# Patient Record
Sex: Male | Born: 1970 | Race: Black or African American | Hispanic: No | Marital: Married | Smoking: Current some day smoker
Health system: Southern US, Community
[De-identification: ages and names within clinical notes are randomized; demographics above are authoritative.]

## PROBLEM LIST (undated history)

## (undated) ENCOUNTER — Ambulatory Visit: Admission: EM | Discharge status: Absent without leave | Payer: BLUE CROSS/BLUE SHIELD

## (undated) DIAGNOSIS — M5020 Other cervical disc displacement, unspecified cervical region: Secondary | ICD-10-CM

## (undated) DIAGNOSIS — M5030 Other cervical disc degeneration, unspecified cervical region: Secondary | ICD-10-CM

## (undated) HISTORY — PX: TONSILLECTOMY: SUR1361

## (undated) HISTORY — PX: VASECTOMY: SHX75

## (undated) HISTORY — PX: OTHER SURGICAL HISTORY: SHX169

---

## 2006-07-17 ENCOUNTER — Ambulatory Visit: Payer: Self-pay | Admitting: Otolaryngology

## 2007-09-07 ENCOUNTER — Ambulatory Visit: Payer: Self-pay | Admitting: Unknown Physician Specialty

## 2008-02-01 ENCOUNTER — Ambulatory Visit: Payer: Self-pay | Admitting: Family Medicine

## 2012-07-23 ENCOUNTER — Ambulatory Visit: Payer: Self-pay | Admitting: Internal Medicine

## 2014-05-31 ENCOUNTER — Ambulatory Visit: Payer: Self-pay | Admitting: Physician Assistant

## 2014-06-02 ENCOUNTER — Ambulatory Visit: Payer: Self-pay | Admitting: Physician Assistant

## 2014-09-29 ENCOUNTER — Ambulatory Visit: Payer: Self-pay | Admitting: Emergency Medicine

## 2015-05-02 ENCOUNTER — Ambulatory Visit
Admission: EM | Admit: 2015-05-02 | Discharge: 2015-05-02 | Disposition: A | Discharge status: Home / Self Care | Payer: BLUE CROSS/BLUE SHIELD | Attending: Family Medicine | Admitting: Family Medicine

## 2015-05-02 DIAGNOSIS — R509 Fever, unspecified: Secondary | ICD-10-CM | POA: Insufficient documentation

## 2015-05-02 DIAGNOSIS — S80262S Insect bite (nonvenomous), left knee, sequela: Secondary | ICD-10-CM | POA: Diagnosis not present

## 2015-05-02 DIAGNOSIS — E871 Hypo-osmolality and hyponatremia: Secondary | ICD-10-CM | POA: Insufficient documentation

## 2015-05-02 DIAGNOSIS — W57XXXS Bitten or stung by nonvenomous insect and other nonvenomous arthropods, sequela: Secondary | ICD-10-CM | POA: Insufficient documentation

## 2015-05-02 LAB — COMPREHENSIVE METABOLIC PANEL
ALT: 43 U/L (ref 17–63)
AST: 27 U/L (ref 15–41)
Albumin: 4 g/dL (ref 3.5–5.0)
Alkaline Phosphatase: 60 U/L (ref 38–126)
Anion gap: 10 (ref 5–15)
BUN: 11 mg/dL (ref 6–20)
CO2: 24 mmol/L (ref 22–32)
Calcium: 8.7 mg/dL — ABNORMAL LOW (ref 8.9–10.3)
Chloride: 98 mmol/L — ABNORMAL LOW (ref 101–111)
Creatinine, Ser: 1.09 mg/dL (ref 0.61–1.24)
GFR calc Af Amer: >60 mL/min (ref >60)
GFR calc non Af Amer: >60 mL/min (ref >60)
Glucose, Bld: 96 mg/dL (ref 65–99)
Potassium: 3.7 mmol/L (ref 3.5–5.1)
Sodium: 132 mmol/L — ABNORMAL LOW (ref 135–145)
Total Bilirubin: 0.9 mg/dL (ref 0.3–1.2)
Total Protein: 8.2 g/dL — ABNORMAL HIGH (ref 6.5–8.1)

## 2015-05-02 LAB — CBC WITH DIFFERENTIAL/PLATELET
Basophils Absolute: 0.1 10*3/uL (ref 0–0.1)
Basophils Relative: 1 %
Eosinophils Absolute: 0.2 10*3/uL (ref 0–0.7)
Eosinophils Relative: 1 %
HCT: 40.8 % (ref 40.0–52.0)
Hemoglobin: 13.5 g/dL (ref 13.0–18.0)
Lymphocytes Relative: 11 %
Lymphs Abs: 1.8 10*3/uL (ref 1.0–3.6)
MCH: 25.7 pg — ABNORMAL LOW (ref 26.0–34.0)
MCHC: 33.1 g/dL (ref 32.0–36.0)
MCV: 77.6 fL — ABNORMAL LOW (ref 80.0–100.0)
Monocytes Absolute: 0.9 10*3/uL (ref 0.2–1.0)
Monocytes Relative: 6 %
Neutro Abs: 13.1 10*3/uL — ABNORMAL HIGH (ref 1.4–6.5)
Neutrophils Relative %: 81 %
Platelets: 322 10*3/uL (ref 150–440)
RBC: 5.25 MIL/uL (ref 4.40–5.90)
RDW: 13.6 % (ref 11.5–14.5)
WBC: 16.2 10*3/uL — ABNORMAL HIGH (ref 3.8–10.6)

## 2015-05-02 LAB — SEDIMENTATION RATE: Sed Rate: 29 mm/hr — ABNORMAL HIGH (ref 0–15)

## 2015-05-02 LAB — RAPID INFLUENZA A&B ANTIGENS
Influenza A (ARMC): NOT DETECTED
Influenza B (ARMC): NOT DETECTED

## 2015-05-02 MED ORDER — DOXYCYCLINE HYCLATE 100 MG PO CAPS: 100.0000 mg | ORAL_CAPSULE | Freq: Two times a day (BID) | ORAL | Status: DC

## 2015-05-02 MED ORDER — IBUPROFEN 800 MG PO TABS
800.0000 mg | ORAL_TABLET | Freq: Once | ORAL | Status: AC
  Administered 2015-05-02: 800 mg via ORAL

## 2015-05-02 NOTE — ED Notes (Signed)
Patient states that he woke up on Saturday, he states that he felt weak, fatigue, chills and night sweats. Patient states that he improved on Monday but is now worse again. He states that he was bit by a tick 3 weeks ago. Patient states that area is a little swollen. States that he is having back and joint pain. Patient states that he has been the worse today than any other day.

## 2015-05-02 NOTE — Discharge Instructions (Signed)
Fever, Adult A fever is a temperature of 100.4 F (38 C) or above.  HOME CARE  Take fever medicine as told by your doctor. Do not  take aspirin for fever if you are younger than 42 years of age.  If you are given antibiotic medicine, take it as told. Finish the medicine even if you start to feel better.  Rest.  Drink enough fluids to keep your pee (urine) clear or pale yellow. Do not drink alcohol.  Take a bath or shower with room temperature water. Do not use ice water or alcohol sponge baths.  Wear lightweight, loose clothes. GET HELP RIGHT AWAY IF:   You are short of breath or have trouble breathing.  You are very weak.  You are dizzy or you pass out (faint).  You are very thirsty or are making little or no urine.  You have new pain.  You throw up (vomit) or have watery poop (diarrhea).  You keep throwing up or having watery poop for more than 1 to 2 days.  You have a stiff neck or light bothers your eyes.  You have a skin rash.  You have a fever or problems (symptoms) that last for more than 2 to 3 days.  You have a fever and your problems quickly get worse.  You keep throwing up the fluids you drink.  You do not feel better after 3 days.  You have new problems. MAKE SURE YOU:   Understand these instructions.  Will watch your condition.  Will get help right away if you are not doing well or get worse. Document Released: 09/09/2008 Document Revised: 02/23/2012 Document Reviewed: 10/02/2011 [facility] Patient Information 2015 [place], [place]. This information is not intended to replace advice given to you by your health care provider. Make sure you discuss any questions you have with your health care provider.  Fever, Adult A fever is a higher than normal body temperature. In an adult, an oral temperature around 98.6 F (37 C) is considered normal. A temperature of 100.4 F (38 C) or higher is generally considered a fever. Mild or moderate fevers generally  have no long-term effects and often do not require treatment. Extreme fever (greater than or equal to 106 F or 41.1 C) can cause seizures. The sweating that may occur with repeated or prolonged fever may cause dehydration. Elderly people can develop confusion during a fever. A measured temperature can vary with:  Age.  Time of day.  Method of measurement (mouth, underarm, rectal, or ear). The fever is confirmed by taking a temperature with a thermometer. Temperatures can be taken different ways. Some methods are accurate and some are not.  An oral temperature is used most commonly. Electronic thermometers are fast and accurate.  An ear temperature will only be accurate if the thermometer is positioned as recommended by the manufacturer.  A rectal temperature is accurate and done for those adults who have a condition where an oral temperature cannot be taken.  An underarm (axillary) temperature is not accurate and not recommended. Fever is a symptom, not a disease.  CAUSES   Infections commonly cause fever.  Some noninfectious causes for fever include:  Some arthritis conditions.  Some thyroid or adrenal gland conditions.  Some immune system conditions.  Some types of cancer.  A medicine reaction.  High doses of certain street drugs such as methamphetamine.  Dehydration.  Exposure to high outside or room temperatures.  Occasionally, the source of a fever cannot be determined. This  is sometimes called a "fever of unknown origin" (FUO).  Some situations may lead to a temporary rise in body temperature that may go away on its own. Examples are:  Childbirth.  Surgery.  Intense exercise. HOME CARE INSTRUCTIONS   Take appropriate medicines for fever. Follow dosing instructions carefully. If you use acetaminophen to reduce the fever, be careful to avoid taking other medicines that also contain acetaminophen. Do not take aspirin for a fever if you are younger than age 42.  There is an association with Reye's syndrome. Reye's syndrome is a rare but potentially deadly disease.  If an infection is present and antibiotics have been prescribed, take them as directed. Finish them even if you start to feel better.  Rest as needed.  Maintain an adequate fluid intake. To prevent dehydration during an illness with prolonged or recurrent fever, you may need to drink extra fluid.Drink enough fluids to keep your urine clear or pale yellow.  Sponging or bathing with room temperature water may help reduce body temperature. Do not use ice water or alcohol sponge baths.  Dress comfortably, but do not over-bundle. SEEK MEDICAL CARE IF:   You are unable to keep fluids down.  You develop vomiting or diarrhea.  You are not feeling at least partly better after 3 days.  You develop new symptoms or problems. SEEK IMMEDIATE MEDICAL CARE IF:   You have shortness of breath or trouble breathing.  You develop excessive weakness.  You are dizzy or you faint.  You are extremely thirsty or you are making little or no urine.  You develop new pain that was not there before (such as in the head, neck, chest, back, or abdomen).  You have persistent vomiting and diarrhea for more than 1 to 2 days.  You develop a stiff neck or your eyes become sensitive to light.  You develop a skin rash. Fever, Adult A fever is a higher than normal body temperature. In an adult, an oral temperature around 98.6 F (37 C) is considered normal. A temperature of 100.4 F (38 C) or higher is generally considered a fever. Mild or moderate fevers generally have no long-term effects and often do not require treatment. Extreme fever (greater than or equal to 106 F or 41.1 C) can cause seizures. The sweating that may occur with repeated or prolonged fever may cause dehydration. Elderly people can develop confusion during a fever. A measured temperature can vary with: Age. Time of day. Method of  measurement (mouth, underarm, rectal, or ear). The fever is confirmed by taking a temperature with a thermometer. Temperatures can be taken different ways. Some methods are accurate and some are not. An oral temperature is used most commonly. Electronic thermometers are fast and accurate. An ear temperature will only be accurate if the thermometer is positioned as recommended by the manufacturer. A rectal temperature is accurate and done for those adults who have a condition where an oral temperature cannot be taken. An underarm (axillary) temperature is not accurate and not recommended. Fever is a symptom, not a disease.  CAUSES  Infections commonly cause fever. Some noninfectious causes for fever include: Some arthritis conditions. Some thyroid or adrenal gland conditions. Some immune system conditions. Some types of cancer. A medicine reaction. High doses of certain street drugs such as methamphetamine. Dehydration. Exposure to high outside or room temperatures. Occasionally, the source of a fever cannot be determined. This is sometimes called a "fever of unknown origin" (FUO). Some situations may lead to a  temporary rise in body temperature that may go away on its own. Examples are: Childbirth. Surgery. Intense exercise. HOME CARE INSTRUCTIONS  Take appropriate medicines for fever. Follow dosing instructions carefully. If you use acetaminophen to reduce the fever, be careful to avoid taking other medicines that also contain acetaminophen. Do not take aspirin for a fever if you are younger than age 42. There is an association with Reye's syndrome. Reye's syndrome is a rare but potentially deadly disease. If an infection is present and antibiotics have been prescribed, take them as directed. Finish them even if you start to feel better. Rest as needed. Maintain an adequate fluid intake. To prevent dehydration during an illness with prolonged or recurrent fever, you may need to drink  extra fluid.Drink enough fluids to keep your urine clear or pale yellow. Sponging or bathing with room temperature water may help reduce body temperature. Do not use ice water or alcohol sponge baths. Dress comfortably, but do not over-bundle. SEEK MEDICAL CARE IF:  You are unable to keep fluids down. You develop vomiting or diarrhea. You are not feeling at least partly better after 3 days. You develop new symptoms or problems. SEEK IMMEDIATE MEDICAL CARE IF:  You have shortness of breath or trouble breathing. You develop excessive weakness. You are dizzy or you faint. You are extremely thirsty or you are making little or no urine. You develop new pain that was not there before (such as in the head, neck, chest, back, or abdomen). You have persistent vomiting and diarrhea for more than 1 to 2 days. You develop a stiff neck or your eyes become sensitive to light. You develop a skin rash. You have a fever or persistent symptoms for more than 2 to 3 days. You have a fever and your symptoms suddenly get worse. MAKE SURE YOU:  Understand these instructions. Will watch your condition. Will get help right away if you are not doing well or get worse. Document Released: 05/27/2001 Document Revised: 04/17/2014 Document Reviewed: 10/02/2011 [facility] Patient Information 2015 [place], [place]. This information is not intended to replace advice given to you by your health care provider. Make sure you discuss any questions you have with your health care provider.  Fever, Adult A fever is a higher than normal body temperature. In an adult, an oral temperature around 98.6 F (37 C) is considered normal. A temperature of 100.4 F (38 C) or higher is generally considered a fever. Mild or moderate fevers generally have no long-term effects and often do not require treatment. Extreme fever (greater than or equal to 106 F or 41.1 C) can cause seizures. The sweating that may occur with repeated or prolonged  fever may cause dehydration. Elderly people can develop confusion during a fever. A measured temperature can vary with: Age. Time of day. Method of measurement (mouth, underarm, rectal, or ear). The fever is confirmed by taking a temperature with a thermometer. Temperatures can be taken different ways. Some methods are accurate and some are not. An oral temperature is used most commonly. Electronic thermometers are fast and accurate. An ear temperature will only be accurate if the thermometer is positioned as recommended by the manufacturer. A rectal temperature is accurate and done for those adults who have a condition where an oral temperature cannot be taken. An underarm (axillary) temperature is not accurate and not recommended. Fever is a symptom, not a disease.  CAUSES  Infections commonly cause fever. Some noninfectious causes for fever include: Some arthritis conditions. Some thyroid  or adrenal gland conditions. Some immune system conditions. Some types of cancer. A medicine reaction. High doses of certain street drugs such as methamphetamine. Dehydration. Exposure to high outside or room temperatures. Occasionally, the source of a fever cannot be determined. This is sometimes called a "fever of unknown origin" (FUO). Some situations may lead to a temporary rise in body temperature that may go away on its own. Examples are: Childbirth. Surgery. Intense exercise. HOME CARE INSTRUCTIONS  Take appropriate medicines for fever. Follow dosing instructions carefully. If you use acetaminophen to reduce the fever, be careful to avoid taking other medicines that also contain acetaminophen. Do not take aspirin for a fever if you are younger than age 52. There is an association with Reye's syndrome. Reye's syndrome is a rare but potentially deadly disease. If an infection is present and antibiotics have been prescribed, take them as directed. Finish them even if you start to feel  better. Rest as needed. Maintain an adequate fluid intake. To prevent dehydration during an illness with prolonged or recurrent fever, you may need to drink extra fluid.Drink enough fluids to keep your urine clear or pale yellow. Sponging or bathing with room temperature water may help reduce body temperature. Do not use ice water or alcohol sponge baths. Dress comfortably, but do not over-bundle. SEEK MEDICAL CARE IF:  You are unable to keep fluids down. You develop vomiting or diarrhea. You are not feeling at least partly better after 3 days. You develop new symptoms or problems. SEEK IMMEDIATE MEDICAL CARE IF:  You have shortness of breath or trouble breathing. You develop excessive weakness. You are dizzy or you faint. You are extremely thirsty or you are making little or no urine. You develop new pain that was not there before (such as in the head, neck, chest, back, or abdomen). You have persistent vomiting and diarrhea for more than 1 to 2 days. You develop a stiff neck or your eyes become sensitive to light. You develop a skin rash. You have a fever or persistent symptoms for more than 2 to 3 days. You have a fever and your symptoms suddenly get worse. MAKE SURE YOU:  Understand these instructions. Will watch your condition. Will get help right away if you are not doing well or get worse. Document Released: 05/27/2001 Document Revised: 04/17/2014 Document Reviewed: 10/02/2011 [facility] Patient Information 2015 [place], [place]. This information is not intended to replace advice given to you by your health care provider. Make sure you discuss any questions you have with your health care provider.   You have a fever or persistent symptoms for more than 2 to 3 days.  You have a fever and your symptoms suddenly get worse. MAKE SURE YOU:   Understand these instructions.  Will watch your condition.  Will get help right away if you are not doing well or get worse. Document  Released: 05/27/2001 Document Revised: 04/17/2014 Document Reviewed: 10/02/2011 [facility] Patient Information 2015 [place], [place]. This information is not intended to replace advice given to you by your health care provider. Make sure you discuss any questions you have with your health care provider.

## 2015-05-02 NOTE — ED Provider Notes (Signed)
CSN: [id number]     Arrival date & time 05/02/15  1719 History   None    Chief Complaint  Patient presents with  . Fever   (Consider location/radiation/quality/duration/timing/severity/associated sxs/prior Treatment) HPI  This is a 44 year old man who presents with fever and fatigue since Saturday, 04/28/2015. He relates that 3 weeks ago he had pulled a tick off his left popliteal fossa and other than local reaction and itching he had no further sequelae. The tick was firmly attached but not engorged. Since the onset of his fever on Saturday he has been having night sweats and sore hips and knees. Fever today presentation was 103F. Yesterday he was going to come to the doctor started feeling better after he moved around a bit. Today however has been much worse and even fell asleep in the truck on 2 occasions because of fatigue. He does not remember seeing a rash. He had his flu shot in November. He denies any cough worse up her respiratory symptoms he has no urinary tract symptoms and denies any nausea vomiting or diarrhea.  History reviewed. No pertinent past medical history. Past Surgical History  Procedure Laterality Date  . Vasectomy    . Tonsillectomy  age 44   Family History  Problem Relation Age of Onset  . Heart failure Mother    History  Substance Use Topics  . Smoking status: Former Smoker    Quit date: 05/02/2011  . Smokeless tobacco: Not on file  . Alcohol Use: 0.0 oz/week    0 Standard drinks or equivalent per week     Comment: occasionally    Review of Systems  Constitutional: Positive for fever, chills, diaphoresis and fatigue.  Musculoskeletal: Positive for myalgias, back pain and arthralgias.  All other systems reviewed and are negative.   Allergies  Review of patient's allergies indicates no known allergies.  Home Medications   Prior to Admission medications   Medication Sig Start Date End Date Taking? Authorizing Provider  gabapentin (NEURONTIN) 100 MG  capsule Take 100 mg by mouth 3 (three) times daily.   Yes Historical Provider, MD  meloxicam (MOBIC) 15 MG tablet Take 15 mg by mouth daily.   Yes Historical Provider, MD  doxycycline (VIBRAMYCIN) 100 MG capsule Take 1 capsule (100 mg total) by mouth 2 (two) times daily. 05/02/15   Chrissie NoaWilliam P Dorsey Authement, PA-C   BP 133/81 mmHg  Pulse 100  Temp(Src) 101.3 F (38.5 C) (Tympanic)  Resp 20  Ht 5\' 11"  (1.803 m)  Wt 240 lb (108.863 kg)  BMI 33.49 kg/m2  SpO2 99% Physical Exam  Constitutional: He is oriented to person, place, and time. He appears well-developed and well-nourished.  HENT:  Head: Normocephalic and atraumatic.  Right Ear: External ear normal.  Left Ear: External ear normal.  Mouth/Throat: Oropharynx is clear and moist.  Eyes: EOM are normal. Pupils are equal, round, and reactive to light. Right eye exhibits no discharge. Left eye exhibits no discharge. No scleral icterus.  Neck: Normal range of motion. Neck supple. No JVD present. No thyromegaly present.  Cardiovascular: Normal rate, regular rhythm and normal heart sounds.  Exam reveals no gallop and no friction rub.   No murmur heard. Pulmonary/Chest: Effort normal and breath sounds normal. No stridor. No respiratory distress. He has no wheezes. He has no rales. He exhibits no tenderness.  Abdominal: Soft. Bowel sounds are normal. He exhibits no distension and no mass. There is no tenderness. There is no rebound and no guarding.  Musculoskeletal: Normal range  of motion. He exhibits no edema or tenderness.  Lymphadenopathy:    He has no cervical adenopathy.  Neurological: He is alert and oriented to person, place, and time. He has normal reflexes.  Skin: Skin is warm. He is diaphoretic.  Elevation of his back shows a tracheal type macular lesions scattered over his back. There are no other rashes on his trunk anteriorly on his legs or arms.  Psychiatric: He has a normal mood and affect. His behavior is normal. Judgment and thought  content normal.    ED Course  Procedures (including critical care time) Labs Review Labs Reviewed  CBC WITH DIFFERENTIAL/PLATELET - Abnormal; Notable for the following:    WBC 16.2 (*)    MCV 77.6 (*)    MCH 25.7 (*)    Neutro Abs 13.1 (*)    All other components within normal limits  SEDIMENTATION RATE - Abnormal; Notable for the following:    Sed Rate 29 (*)    All other components within normal limits  COMPREHENSIVE METABOLIC PANEL - Abnormal; Notable for the following:    Sodium 132 (*)    Chloride 98 (*)    Calcium 8.7 (*)    Total Protein 8.2 (*)    All other components within normal limits  INFLUENZA A&B ANTIGENS([facility])  ROCKY MTN SPOTTED FVR ABS PNL(IGG+IGM)  B. BURGDORFI ANTIBODIES    Imaging Review No results found.   MDM   1. Tick bite of left popliteal region, sequela   2. Fever and chills   3. Hyponatremia    New Prescriptions   DOXYCYCLINE (VIBRAMYCIN) 100 MG CAPSULE    Take 1 capsule (100 mg total) by mouth 2 (two) times daily.   I discussed the laboratory findings and the potential diagnosis. Because of his increased white count and neutrophils and his hyponatremia and fever following a tick bite 3 weeks ago we will start him empirically on doxycycline 100 mg twice a day for 10 days. [facility] and Lyme were obtained. He'll continue to take ibuprofen and alternate with Tylenol to keep his fever under control. Will keep amount of work until Monday and hopefully the fever will have resolved by that time. He'll follow-up here in 48 hours if he is not improving or he may decide to go to his PCP instead. Copies of his laboratories were provided to the patient.    Lutricia FeilWilliam P Lucresia Simic, PA-C 05/02/15 1949

## 2015-05-04 LAB — ROCKY MTN SPOTTED FVR ABS PNL(IGG+IGM)
RMSF IgG: NEGATIVE
RMSF IgM: 0.8 index (ref 0.00–0.89)

## 2015-09-20 ENCOUNTER — Ambulatory Visit: Payer: BLUE CROSS/BLUE SHIELD

## 2015-09-20 ENCOUNTER — Ambulatory Visit
Admission: EM | Admit: 2015-09-20 | Discharge: 2015-09-20 | Disposition: A | Discharge status: Home / Self Care | Payer: BLUE CROSS/BLUE SHIELD | Attending: Family Medicine | Admitting: Family Medicine

## 2015-09-20 ENCOUNTER — Encounter: Payer: Self-pay | Admitting: Emergency Medicine

## 2015-09-20 DIAGNOSIS — S62604A Fracture of unspecified phalanx of right ring finger, initial encounter for closed fracture: Secondary | ICD-10-CM

## 2015-09-20 IMAGING — CR DG FINGER RING 2+V*R*
3 series · 4 of 4 positions shown · non-contrast
Comparison: None.

CLINICAL DATA: Injured the DIP joint of the right fourth finger
with swelling

EXAM:
RIGHT RING FINGER 2+V

[finger ap]
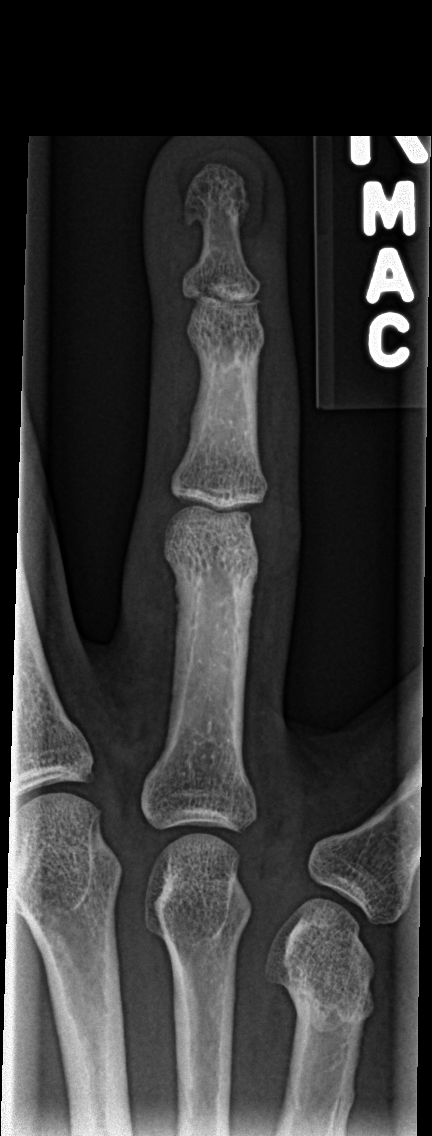

[finger obl]
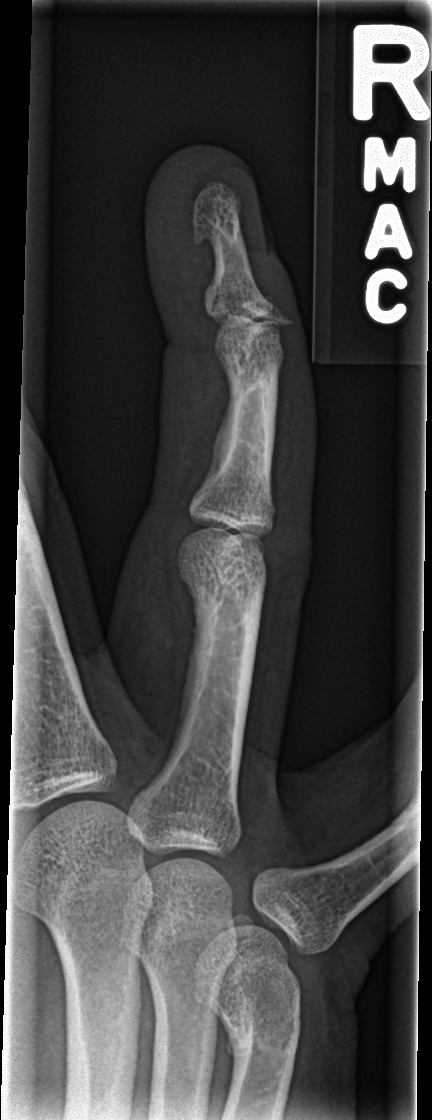

[Series 3: finger lat · 0.14mm/px · 2 of 2 slices shown]
[im 1/2]
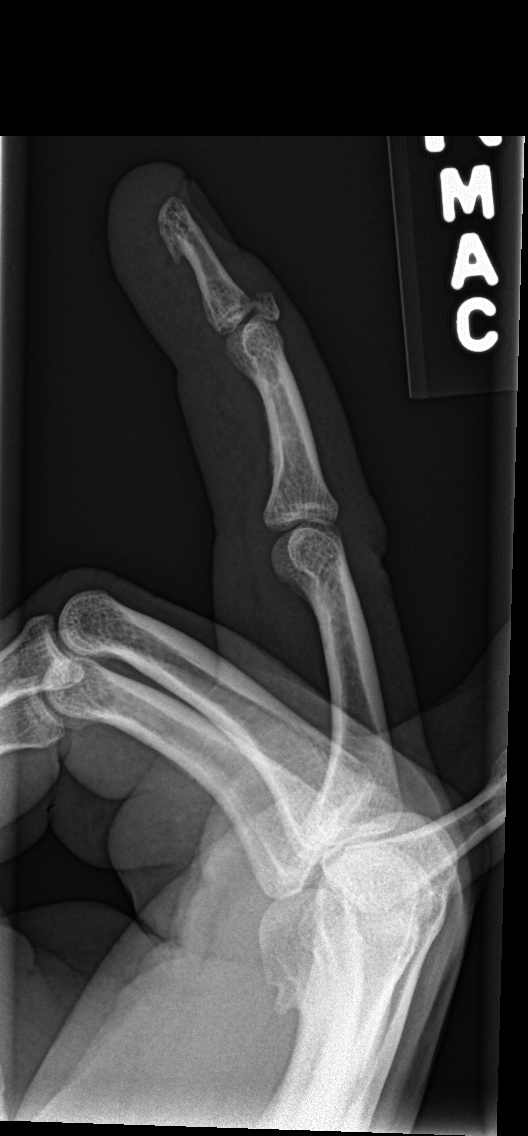
[im 2/2]
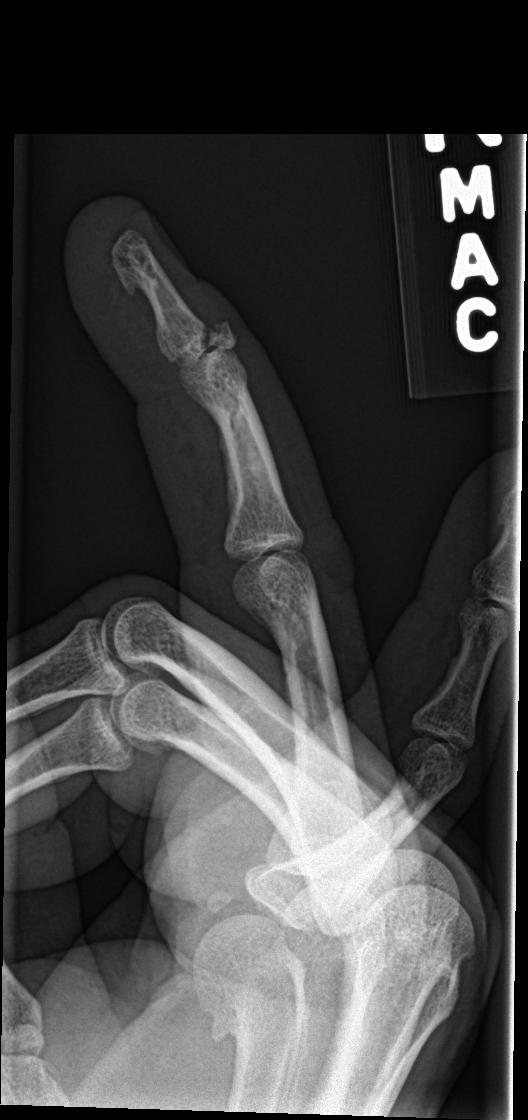

[4 of 4 positions shown; findings below may reference images not displayed]

FINDINGS: There is an avulsion fracture fragment from the base of the distal
phalanx of the right fourth digit on the dorsal aspect. No other
acute abnormality is seen.
IMPRESSION: Avulsion fracture from the base of the distal phalanx of the right
fourth digit.

## 2015-09-20 MED ORDER — MELOXICAM 15 MG PO TABS: 15.0000 mg | ORAL_TABLET | Freq: Every day | ORAL | Status: DC | PRN

## 2015-09-20 NOTE — ED Notes (Signed)
Injured ring right right hand playing football this morning

## 2015-09-20 NOTE — ED Provider Notes (Signed)
[facility] Emergency Department Provider Note  ____________________________________________  Time seen: Approximately 10:59 AM  I have reviewed the triage vital signs and the nursing notes.   HISTORY  Chief Complaint Finger Injury   HPI Jason Vaughan is a 44 y.o. male presents for complaint of right fourth finger pain. Reports that this morning at 7 AM he was playing catch with a football with his son. States the football hit directly on his fourth finger extended. States pain, swelling and pain with movement since. Denies fall or other injury. Denies head injury or loss consciousness. Denies other pain. States current pain is 3 out of 10. Denies tingling, numbness.   History reviewed. No pertinent past medical history.  There are no active problems to display for this patient.   Past Surgical History  Procedure Laterality Date  . Vasectomy    . Tonsillectomy  age 60  . Vascetomy      Current Outpatient Rx  Name  Route  Sig  Dispense  Refill  .           .           .           .             Allergies Review of patient's allergies indicates no known allergies.  Family History  Problem Relation Age of Onset  . Heart failure Mother     Social History Social History  Substance Use Topics  . Smoking status: Former Smoker    Quit date: 05/02/2011  . Smokeless tobacco: None  . Alcohol Use: 0.0 oz/week    0 Standard drinks or equivalent per week     Comment: occasionally    Review of Systems Constitutional: No fever/chills Eyes: No visual changes. ENT: No sore throat. Cardiovascular: Denies chest pain. Respiratory: Denies shortness of breath. Gastrointestinal: No abdominal pain.  No nausea, no vomiting.  No diarrhea.  No constipation. Genitourinary: Negative for dysuria. Musculoskeletal: Negative for back pain.right fourth finger pain Neurological: Negative for headaches, focal weakness or numbness.  10-point ROS otherwise  negative.  ____________________________________________   PHYSICAL EXAM:  VITAL SIGNS: ED Triage Vitals  Enc Vitals Group     BP 09/20/15 0915 108/74 mmHg     Pulse Rate 09/20/15 0915 66     Resp 09/20/15 0915 18     Temp 09/20/15 0915 97.5 F (36.4 C)     Temp src -- oral     SpO2 09/20/15 0915 98 %     Weight 09/20/15 0915 235 lb (106.595 kg)     Height 09/20/15 0915 6' (1.829 m)     Head Cir --      Peak Flow --      Pain Score 09/20/15 0918 7     Pain Loc --      Pain Edu? --      Excl. in GC? --     Constitutional: Alert and oriented. Well appearing and in no acute distress. Eyes: Conjunctivae are normal. PERRL. EOMI. Head: Atraumatic.  Nose: No congestion/rhinnorhea.  Mouth/Throat: Mucous membranes are moist.  Neck: No stridor.  No cervical spine tenderness to palpation. Hematological/Lymphatic/Immunilogical: No cervical lymphadenopathy. Cardiovascular: Normal rate, regular rhythm. Grossly normal heart sounds.  Good peripheral circulation. Respiratory: Normal respiratory effort.  No retractions. Lungs CTAB. Gastrointestinal: Soft and nontender. No distention. Normal Bowel sounds. . Musculoskeletal: No lower or upper extremity tenderness nor edema.  No joint effusions. Bilateral pedal pulses  equal and easily palpated.  Except: Right fourth distal finger mild swelling and moderate tenderness at dip joint, sensation intact, full flexion but with difficulty, pain and limited extension, unable to fully extend right fourth distal digit. Right hand otherwise nontender. Refill less than 2 seconds. Neurologic:  Normal speech and language. No gross focal neurologic deficits are appreciated. No gait instability. Skin:  Skin is warm, dry and intact. No rash noted. Psychiatric: Mood and affect are normal. Speech and behavior are normal.  ____________________________________________   LABS (all labs ordered are listed, but only abnormal results are displayed)  Labs Reviewed -  No data to display  RADIOLOGY  EXAM: RIGHT RING FINGER 2+V  COMPARISON: None.  FINDINGS: There is an avulsion fracture fragment from the base of the distal phalanx of the right fourth digit on the dorsal aspect. No other acute abnormality is seen.  IMPRESSION: Avulsion fracture from the base of the distal phalanx of the right fourth digit.   Electronically Signed By: Dwyane Dee M.D. On: 09/20/2015 10:40      I, Renford Dills, personally viewed and evaluated these images (plain radiographs) as part of my medical decision making.   ____________________________________________   PROCEDURES  Procedure(s) performed:   Right fourth finger placed and full extension with aluminum finger splint and her right third and fourth fingers buddy taped. Neurovascular intact post application. ____________________________   INITIAL IMPRESSION / ASSESSMENT AND PLAN / ED COURSE  Pertinent labs & imaging results that were available during my care of the patient were reviewed by me and considered in my medical decision making (see chart for details).   Very well-appearing patient. No acute distress. Presents for the complaints of right ring finger distal pain post injury this morning. Patient states he was trying to catch football thrown from his son and football hit his finger. Denies fall.  Right fourth finger distal dorsal pain at DIP joint. X-ray reviewed. Avulsion fracture from the base of the distal phalanx of the right fourth digit. Limited extension of this finger and concern for mallet finger. Discussed this patient with Dr. Martha Clan orthopedic. Dr. Martha Clan recommends splinting and full extension and follow-up next week in office. Patient to follow-up in office for further evaluation and evaluation if possible surgical intervention needed.  Patient very well-appearing and states minimal pain. Denies need for strong pain medicine. States history of taken Mobic with good  results and request Mobic prescription. Rx for 15 mg daily Mobic given. Instructed to ice and elevate. Keep in splint. Follow-up with orthopedic next week. RN called and obtained appointment. Patient has an appointment 09/25/2015 at 9 AM with Dr. Deeann Saint orthopedist. Patient verbalizes understanding. Discussed follow up and return parameters including no resolution or any worsening concerns. Patient verbalized understanding and agreed to plan.    ____________________________________________   FINAL CLINICAL IMPRESSION(S) / ED DIAGNOSES  Final diagnoses:  Fracture of phalanx of right ring finger, closed, initial encounter       Renford Dills, NP 09/20/15 1149  Renford Dills, NP 09/20/15 1151

## 2015-09-20 NOTE — Discharge Instructions (Signed)
Finger Fracture  Fractures of fingers are breaks in the bones of the fingers. There are many types of fractures. There are different ways of treating these fractures. Your health care provider will discuss the best way to treat your fracture.  CAUSES  Traumatic injury is the main cause of broken fingers. These include:  · Injuries while playing sports.  · Workplace injuries.  · Falls.  RISK FACTORS  Activities that can increase your risk of finger fractures include:  · Sports.  · Workplace activities that involve machinery.  · A condition called osteoporosis, which can make your bones less dense and cause them to fracture more easily.  SIGNS AND SYMPTOMS  The main symptoms of a broken finger are pain and swelling within 15 minutes after the injury. Other symptoms include:  · Bruising of your finger.  · Stiffness of your finger.  · Numbness of your finger.  · Exposed bones (compound fracture) if the fracture is severe.  DIAGNOSIS   The best way to diagnose a broken bone is with X-ray imaging. Additionally, your health care provider will use this X-ray image to evaluate the position of the broken finger bones.   TREATMENT   Finger fractures can be treated with:   · Nonreduction--This means the bones are in place. The finger is splinted without changing the positions of the bone pieces. The splint is usually left on for about a week to 10 days. This will depend on your fracture and what your health care provider thinks.  · Closed reduction--The bones are put back into position without using surgery. The finger is then splinted.  · Open reduction and internal fixation--The fracture site is opened. Then the bone pieces are fixed into place with pins or some type of hardware. This is seldom required. It depends on the severity of the fracture.  HOME CARE INSTRUCTIONS   · Follow your health care provider's instructions regarding activities, exercises, and physical therapy.  · Only take over-the-counter or prescription  medicines for pain, discomfort, or fever as directed by your health care provider.  SEEK MEDICAL CARE IF:  You have pain or swelling that limits the motion or use of your fingers.  SEEK IMMEDIATE MEDICAL CARE IF:   Your finger becomes numb.  MAKE SURE YOU:   · Understand these instructions.  · Will watch your condition.  · Will get help right away if you are not doing well or get worse.     This information is not intended to replace advice given to you by your health care provider. Make sure you discuss any questions you have with your health care provider.     Document Released: 03/15/2001 Document Revised: 09/21/2013 Document Reviewed: 07/13/2013  Elsevier Interactive Patient Education ©2016 [employer]

## 2016-01-16 ENCOUNTER — Other Ambulatory Visit: Payer: Self-pay | Admitting: Orthopedic Surgery

## 2016-01-16 DIAGNOSIS — M2352 Chronic instability of knee, left knee: Secondary | ICD-10-CM

## 2016-01-16 DIAGNOSIS — M25562 Pain in left knee: Secondary | ICD-10-CM

## 2016-01-16 DIAGNOSIS — M2392 Unspecified internal derangement of left knee: Secondary | ICD-10-CM

## 2016-02-05 ENCOUNTER — Ambulatory Visit: Admission: RE | Admit: 2016-02-05 | Payer: BLUE CROSS/BLUE SHIELD | Source: Ambulatory Visit

## 2016-02-25 ENCOUNTER — Ambulatory Visit
Admission: RE | Admit: 2016-02-25 | Discharge: 2016-02-25 | Disposition: A | Discharge status: Home / Self Care | Payer: BLUE CROSS/BLUE SHIELD | Source: Ambulatory Visit | Attending: Orthopedic Surgery | Admitting: Orthopedic Surgery

## 2016-02-25 DIAGNOSIS — M25562 Pain in left knee: Secondary | ICD-10-CM

## 2016-02-25 DIAGNOSIS — M2392 Unspecified internal derangement of left knee: Secondary | ICD-10-CM | POA: Diagnosis present

## 2016-02-25 DIAGNOSIS — M2352 Chronic instability of knee, left knee: Secondary | ICD-10-CM

## 2016-02-25 DIAGNOSIS — M23222 Derangement of posterior horn of medial meniscus due to old tear or injury, left knee: Secondary | ICD-10-CM | POA: Diagnosis not present

## 2016-04-04 ENCOUNTER — Encounter: Payer: Self-pay | Admitting: *Deleted

## 2016-04-11 ENCOUNTER — Ambulatory Visit: Payer: BLUE CROSS/BLUE SHIELD | Admitting: Anesthesiology

## 2016-04-11 ENCOUNTER — Encounter
Admission: RE | Disposition: A | Discharge status: Home / Self Care | Payer: Self-pay | Source: Ambulatory Visit | Attending: Unknown Physician Specialty

## 2016-04-11 ENCOUNTER — Ambulatory Visit
Admission: RE | Admit: 2016-04-11 | Discharge: 2016-04-11 | Disposition: A | Discharge status: Home / Self Care | Payer: BLUE CROSS/BLUE SHIELD | Source: Ambulatory Visit | Attending: Unknown Physician Specialty | Admitting: Unknown Physician Specialty

## 2016-04-11 DIAGNOSIS — Z9852 Vasectomy status: Secondary | ICD-10-CM | POA: Diagnosis not present

## 2016-04-11 DIAGNOSIS — Z833 Family history of diabetes mellitus: Secondary | ICD-10-CM | POA: Insufficient documentation

## 2016-04-11 DIAGNOSIS — Z8249 Family history of ischemic heart disease and other diseases of the circulatory system: Secondary | ICD-10-CM | POA: Diagnosis not present

## 2016-04-11 DIAGNOSIS — M23322 Other meniscus derangements, posterior horn of medial meniscus, left knee: Secondary | ICD-10-CM | POA: Insufficient documentation

## 2016-04-11 DIAGNOSIS — Z841 Family history of disorders of kidney and ureter: Secondary | ICD-10-CM | POA: Diagnosis not present

## 2016-04-11 DIAGNOSIS — Z791 Long term (current) use of non-steroidal anti-inflammatories (NSAID): Secondary | ICD-10-CM | POA: Insufficient documentation

## 2016-04-11 DIAGNOSIS — M25562 Pain in left knee: Secondary | ICD-10-CM | POA: Diagnosis present

## 2016-04-11 HISTORY — DX: Other cervical disc displacement, unspecified cervical region: M50.20

## 2016-04-11 HISTORY — PX: KNEE ARTHROSCOPY: SHX127

## 2016-04-11 SURGERY — ARTHROSCOPY, KNEE
Anesthesia: General | Site: Knee | Laterality: Left | Wound class: Clean

## 2016-04-11 MED ORDER — KETOROLAC TROMETHAMINE 30 MG/ML IJ SOLN
INTRAMUSCULAR | Status: DC | PRN
  Administered 2016-04-11: 30 mg via INTRAVENOUS

## 2016-04-11 MED ORDER — PROPOFOL 10 MG/ML IV BOLUS
INTRAVENOUS | Status: DC | PRN
  Administered 2016-04-11: 200 mg via INTRAVENOUS

## 2016-04-11 MED ORDER — GLYCOPYRROLATE 0.2 MG/ML IJ SOLN
INTRAMUSCULAR | Status: DC | PRN
  Administered 2016-04-11: 0.2 mg via INTRAVENOUS

## 2016-04-11 MED ORDER — PROMETHAZINE HCL 25 MG/ML IJ SOLN: 6.2500 mg | INTRAMUSCULAR | Status: DC | PRN

## 2016-04-11 MED ORDER — DEXAMETHASONE SODIUM PHOSPHATE 4 MG/ML IJ SOLN
INTRAMUSCULAR | Status: DC | PRN
  Administered 2016-04-11: 8 mg via INTRAVENOUS

## 2016-04-11 MED ORDER — FENTANYL CITRATE (PF) 100 MCG/2ML IJ SOLN
INTRAMUSCULAR | Status: DC | PRN
  Administered 2016-04-11: 50 ug via INTRAVENOUS
  Administered 2016-04-11: 100 ug via INTRAVENOUS
  Administered 2016-04-11: 50 ug via INTRAVENOUS

## 2016-04-11 MED ORDER — LIDOCAINE HCL (CARDIAC) 20 MG/ML IV SOLN
INTRAVENOUS | Status: DC | PRN
  Administered 2016-04-11: 30 mg via INTRATRACHEAL

## 2016-04-11 MED ORDER — MEPERIDINE HCL 25 MG/ML IJ SOLN: 6.2500 mg | INTRAMUSCULAR | Status: DC | PRN

## 2016-04-11 MED ORDER — ONDANSETRON HCL 4 MG/2ML IJ SOLN
INTRAMUSCULAR | Status: DC | PRN
  Administered 2016-04-11: 4 mg via INTRAVENOUS

## 2016-04-11 MED ORDER — BUPIVACAINE HCL (PF) 0.5 % IJ SOLN
INTRAMUSCULAR | Status: DC | PRN
  Administered 2016-04-11: 20 mL

## 2016-04-11 MED ORDER — MIDAZOLAM HCL 5 MG/5ML IJ SOLN
INTRAMUSCULAR | Status: DC | PRN
  Administered 2016-04-11: 2 mg via INTRAVENOUS

## 2016-04-11 MED ORDER — HYDROMORPHONE HCL 1 MG/ML IJ SOLN
0.2500 mg | INTRAMUSCULAR | Status: DC | PRN
  Administered 2016-04-11: 0.4 mg via INTRAVENOUS
  Administered 2016-04-11: 0.2 mg via INTRAVENOUS
  Administered 2016-04-11 (×2): 0.4 mg via INTRAVENOUS

## 2016-04-11 MED ORDER — OXYCODONE HCL 5 MG PO TABS
5.0000 mg | ORAL_TABLET | Freq: Once | ORAL | Status: AC | PRN
  Administered 2016-04-11: 5 mg via ORAL

## 2016-04-11 MED ORDER — LACTATED RINGERS IV SOLN
INTRAVENOUS | Status: DC
  Administered 2016-04-11: 07:00:00 via INTRAVENOUS

## 2016-04-11 MED ORDER — OXYCODONE HCL 5 MG/5ML PO SOLN: 5.0000 mg | Freq: Once | ORAL | Status: AC | PRN

## 2016-04-11 MED ORDER — LACTATED RINGERS IV SOLN: INTRAVENOUS | Status: DC

## 2016-04-11 MED ORDER — NORCO 5-325 MG PO TABS: 1.0000 | ORAL_TABLET | Freq: Four times a day (QID) | ORAL | Status: DC | PRN

## 2016-04-11 SURGICAL SUPPLY — 52 items
ARTHROWAND PARAGON T2 (SURGICAL WAND)
BLADE ABRADER 4.5 (BLADE) IMPLANT
BLADE FULL RADIUS 3.5 (BLADE) IMPLANT
BLADE SHAVER 4.5X7 STR FR (MISCELLANEOUS) ×3 IMPLANT
BLADE SHAVER AGGRES 5.5  STR (CUTTER)
BLADE SHAVER AGGRES 5.5 STR (CUTTER) IMPLANT
BNDG ESMARK 6X12 TAN STRL LF (GAUZE/BANDAGES/DRESSINGS) IMPLANT
BUR 5.5 NOTCHBLASTER STR (BURR) IMPLANT
BUR ABRADER 4.0 W/FLUTE AQUA (MISCELLANEOUS) IMPLANT
BUR ABRADER 5.5 BLK (MISCELLANEOUS) IMPLANT
BUR ACROMIONIZER 4.0 (BURR) IMPLANT
BUR BR 5.5 WIDE MOUTH (BURR) IMPLANT
BUR RADIUS 3.5 (BURR) IMPLANT
BUR RADIUS 4.0X18.5 (BURR) IMPLANT
BUR ROUND 5.5 (BURR) IMPLANT
BURR 5.5 NOTCHBLASTER STR (BURR)
BURR ABRADER 4.0 W/FLUTE AQUA (MISCELLANEOUS)
BURR ABRADER 5.5 BLK (MISCELLANEOUS)
BURR ROUND 12 FLUTE 4.0MM (BURR) IMPLANT
CANN 8.5MMX72MM THREADED (CANNULA)
CANNULA THRD 8.5X72 DISP (CANNULA) IMPLANT
COVER LIGHT HANDLE UNIVERSAL (MISCELLANEOUS) ×6 IMPLANT
CUFF TOURN SGL QUICK 30 (MISCELLANEOUS) ×2
CUFF TOURN SGL QUICK 34 (TOURNIQUET CUFF)
CUFF TRNQT CYL 34X4X40X1 (TOURNIQUET CUFF) IMPLANT
CUFF TRNQT CYL LO 30X4X (MISCELLANEOUS) ×1 IMPLANT
CUTTER SLOTTED WHISKER 4.0 (BURR) IMPLANT
DRAPE LEGGINS SURG 28X43 STRL (DRAPES) ×3 IMPLANT
DURAPREP 26ML APPLICATOR (WOUND CARE) ×3 IMPLANT
GAUZE SPONGE 4X4 12PLY STRL (GAUZE/BANDAGES/DRESSINGS) ×3 IMPLANT
GLOVE BIO SURGEON STRL SZ7.5 (GLOVE) ×6 IMPLANT
GLOVE BIO SURGEON STRL SZ8 (GLOVE) ×3 IMPLANT
GLOVE INDICATOR 8.0 STRL GRN (GLOVE) ×6 IMPLANT
GOWN STRL REIN 2XL XLG LVL4 (GOWN DISPOSABLE) ×6 IMPLANT
GOWN STRL REUS W/TWL 2XL LVL3 (GOWN DISPOSABLE) IMPLANT
IV LACTATED RINGER IRRG 3000ML (IV SOLUTION) ×4
IV LR IRRIG 3000ML ARTHROMATIC (IV SOLUTION) ×2 IMPLANT
KIT ROOM TURNOVER OR (KITS) ×3 IMPLANT
MANIFOLD 4PT FOR NEPTUNE1 (MISCELLANEOUS) ×3 IMPLANT
PACK ARTHROSCOPY KNEE (MISCELLANEOUS) ×3 IMPLANT
SET TUBE SUCT SHAVER OUTFL 24K (TUBING) ×3 IMPLANT
SOL PREP PVP 2OZ (MISCELLANEOUS) ×3
SOLUTION PREP PVP 2OZ (MISCELLANEOUS) ×1 IMPLANT
SUT ETHILON 3-0 FS-10 30 BLK (SUTURE) ×3
SUTURE EHLN 3-0 FS-10 30 BLK (SUTURE) ×1 IMPLANT
TAPE MICROFOAM 4IN (TAPE) ×3 IMPLANT
TUBING ARTHRO INFLOW-ONLY STRL (TUBING) ×3 IMPLANT
WAND ARTHRO PARAGON T2 (SURGICAL WAND) IMPLANT
WAND HAND CNTRL MULTIVAC 50 (MISCELLANEOUS) IMPLANT
WAND HAND CNTRL MULTIVAC 90 (MISCELLANEOUS) IMPLANT
WAND MEGAVAC 90 (MISCELLANEOUS) IMPLANT
WRAP KNEE W/COLD PACKS 25.5X14 (SOFTGOODS) ×3 IMPLANT

## 2016-04-11 NOTE — Anesthesia Postprocedure Evaluation (Signed)
Anesthesia Post Note  Patient: Jason Vaughan  Procedure(s) Performed: Procedure(s) (LRB): LEFT KNEE ARTHROSCOPY WITH PARTIAL MEDIAL MENISECTOMY (Left)  Patient location during evaluation: PACU Anesthesia Type: General Level of consciousness: awake and alert and oriented Pain management: pain level controlled Vital Signs Assessment: post-procedure vital signs reviewed and stable Respiratory status: spontaneous breathing and nonlabored ventilation Cardiovascular status: stable Postop Assessment: no signs of nausea or vomiting and adequate PO intake Anesthetic complications: no    Harolyn RutherfordJoshua Coleston Dirosa

## 2016-04-11 NOTE — Discharge Instructions (Signed)
General Anesthesia, Adult, Care After °Refer to this sheet in the next few weeks. These instructions provide you with information on caring for yourself after your procedure. Your health care provider may also give you more specific instructions. Your treatment has been planned according to current medical practices, but problems sometimes occur. Call your health care provider if you have any problems or questions after your procedure. °WHAT TO EXPECT AFTER THE PROCEDURE °After the procedure, it is typical to experience: °· Sleepiness. °· Nausea and vomiting. °HOME CARE INSTRUCTIONS °· For the first 24 hours after general anesthesia: °¨ Have a responsible person with you. °¨ Do not drive a car. If you are alone, do not take public transportation. °¨ Do not drink alcohol. °¨ Do not take medicine that has not been prescribed by your health care provider. °¨ Do not sign important papers or make important decisions. °¨ You may resume a normal diet and activities as directed by your health care provider. °· Change bandages (dressings) as directed. °· If you have questions or problems that seem related to general anesthesia, call the hospital and ask for the anesthetist or anesthesiologist on call. °SEEK MEDICAL CARE IF: °· You have nausea and vomiting that continue the day after anesthesia. °· You develop a rash. °SEEK IMMEDIATE MEDICAL CARE IF:  °· You have difficulty breathing. °· You have chest pain. °· You have any allergic problems. °  °This information is not intended to replace advice given to you by your health care provider. Make sure you discuss any questions you have with your health care provider. °  °Document Released: 03/09/2001 Document Revised: 12/22/2014 Document Reviewed: 03/31/2012 °Elsevier Interactive Patient Education ©2016 [employer] ° ° °[facility]  °Mikenna Bunkley B. Judie Hollick, Jr., M.D. [phone]  ° °KNEE ARTHROSCOPY POST OPERATION INSTRUCTIONS: ° °PLEASE  READ THESE INSTRUCTIONS ABOUT POST OPERATION CARE. THEY WILL ANSWER MOST OF YOUR QUESTIONS.  °You have been given a prescription for pain. Please take as directed for pain.  °You can walk, keeping the knee slightly stiff-avoid doing too much bending the first day. (if ACL reconstruction is performed, keep brace locked in extension when walking.)  °You will use crutches or cane if needed. Can weight bear as tolerated  °Plan to take three to four days off from work. You can resume work when you are comfortable. (This can be a week or more, depending on the type of work you do.)  °To reduce pain and swelling, place one to two pillows under the knee the first two or three days when sitting or lying. An ice pack may be placed on top of the area over the dressing. Instructions for making homemade icepack are as follow:  °Flexible homemade alcohol water ice pack  °2 cups water  °1 cup rubbing alcohol  °food coloring for the blue tint (optional)  °2 zip-top bags - gallon-size  °Mix the water and alcohol together in one of your zip-top bags and add food coloring. Release as much air as possible and seal the bag. Place in freezer for at least 12 hours.  °The small incisions in your knee are closed with nylon stitches. They will be removed in the office.  °The bulky dressing may be removed in the third day after surgery. (If ACL surgery-DO NOT REMOVE BANDAGES). Put a waterproof band-aid over each stitch. Do not put any creams or ointments on wounds. You may shower at this time, but change waterproof band-aids after showering. KEEP INCISIONS CLEAN   AND DRY UNTIL YOU RETURN TO THE OFFICE.  °Sometimes the operative area remains somewhat painful and swollen for several weeks. This is usually nothing to worry about, but call if you have any excessive symptoms, especially fever. It is not unusual to have a low grade fever of 99 degrees for the first few days. If persist after 3-4 days call the office. It is not uncommon for the pain  to be a little worse on the third day after surgery.  °Begin doing gentle exercises right away. They will be limited by the amount of pain and swelling you have.  Exercising will reduce the swelling, increase motion, and prevent muscle weakness. Exercises: Straight leg raising and gentle knee bending.  °Take 81 milligram aspirin twice a day for 2 weeks after meals or milk. This along with elevation will help reduce the possibility of phlebitis in your operated leg.  °Avoid strenuous athletics for a minimum of 4 to 6 weeks after arthroscopic surgery (approximately five months if ACL surgery).  °If the surgery included ACL reconstruction the brace that is supplied to the extremity post surgery is to be locked in extension when you are asleep and is to be locked in extension when you are ambulating. It can be unlocked for exercises or sitting.  °Keep your post surgery appointment that has been made for you. If you do not remember the date call [phone]. Your follow up appointment should be between 7-10 days.  ° °

## 2016-04-11 NOTE — Anesthesia Procedure Notes (Signed)
Procedure Name: LMA Insertion Date/Time: 04/11/2016 7:42 AM Performed by: Andee PolesBUSH, Burt Piatek Pre-anesthesia Checklist: Patient identified, Emergency Drugs available, Suction available, Timeout performed and Patient being monitored Patient Re-evaluated:Patient Re-evaluated prior to inductionOxygen Delivery Method: Circle system utilized Preoxygenation: Pre-oxygenation with 100% oxygen Intubation Type: IV induction LMA: LMA inserted LMA Size: 4.0 Number of attempts: 1 Placement Confirmation: positive ETCO2 and breath sounds checked- equal and bilateral Tube secured with: Tape

## 2016-04-11 NOTE — Transfer of Care (Signed)
Immediate Anesthesia Transfer of Care Note  Patient: Jason Vaughan  Procedure(s) Performed: Procedure(s): LEFT KNEE ARTHROSCOPY WITH PARTIAL MEDIAL MENISECTOMY (Left)  Patient Location: PACU  Anesthesia Type: General  Level of Consciousness: awake, alert  and patient cooperative  Airway and Oxygen Therapy: Patient Spontanous Breathing and Patient connected to supplemental oxygen  Post-op Assessment: Post-op Vital signs reviewed, Patient's Cardiovascular Status Stable, Respiratory Function Stable, Patent Airway and No signs of Nausea or vomiting  Post-op Vital Signs: Reviewed and stable  Complications: No apparent anesthesia complications

## 2016-04-11 NOTE — Op Note (Signed)
Patient: Jason Vaughan, [name]  Preoperative diagnosis: Torn medial meniscus left knee  Postop diagnosis: Same  Operation: Arthroscopic partial medial meniscectomy left knee  Surgeon: Randon GoldsmithKERNODLE,Eirene Rather B, MD  Anesthesia: Gen.   History: Patient's had a long history of left knee pain.  The plain films revealed no significant bony pathology .  The patient had an MRI which revealed complex tear of the posterior horn of the medial meniscus.The patient was scheduled for surgery due to persistent discomfort despite conservative treatment.  The patient was taken the operating room where satisfactory general anesthesia was achieved. A tourniquet and leg holder were was applied to the left thigh. A well leg support was applied to the nonoperative extremity. The left knee was prepped and draped in usual fashion for an arthroscopic procedure. An inflow cannula was introduced superomedially. The joint was distended with lactated Ringer's. Scope was introduced through an inferolateral puncture wound and a probe through an inferomedial puncture wound. Inspection of the medial compartment revealed  complex tear of the posterior horn of the medial meniscus. I went ahead and resected the tear using a combination of basket biters and a motorized resector. The patient was noted to have very small area of grade 1-2 chondral pathology in the lateral aspect of the mid weightbearing portion of the medial femoral condyle. Inspection of the intercondylar notch revealed intact cruciates. Inspection of the the lateral compartment revealed no meniscal or chondral pathology.   Trochlear groove was inspected and appeared to be fairly smooth.  Retropatellar surface was smooth. I observed patella tracking from the inferolateral portal. The patella seemed to track fairly well.  The instruments were removed from the joint at this time. The puncture wounds were closed with 3-0 nylon in vertical mattress fashion. I injected each puncture  wound with several cc of half percent Marcaine without epinephrine. Betadine was applied the wounds followed by sterile dressing. An ice pack was applied to the right knee. The patient was awakened and transferred to the stretcher bed. The patient was taken to the recovery room in satisfactory condition.  The tourniquet was not inflated during the course of the procedure. Blood loss was negligible.

## 2016-04-11 NOTE — Anesthesia Preprocedure Evaluation (Signed)
Anesthesia Evaluation  Patient identified by MRN, date of birth, ID band Patient awake    Reviewed: Allergy & Precautions, NPO status , Patient's Chart, lab work & pertinent test results  Airway Mallampati: II  TM Distance: >3 FB Neck ROM: Full    Dental no notable dental hx.    Pulmonary former smoker,    Pulmonary exam normal        Cardiovascular negative cardio ROS Normal cardiovascular exam     Neuro/Psych negative neurological ROS  negative psych ROS   GI/Hepatic negative GI ROS, Neg liver ROS,   Endo/Other  negative endocrine ROS  Renal/GU negative Renal ROS     Musculoskeletal negative musculoskeletal ROS (+)   Abdominal   Peds  Hematology negative hematology ROS (+)   Anesthesia Other Findings   Reproductive/Obstetrics                             Anesthesia Physical Anesthesia Plan  ASA: II  Anesthesia Plan: General   Post-op Pain Management:    Induction: Intravenous  Airway Management Planned: LMA  Additional Equipment:   Intra-op Plan:   Post-operative Plan:   Informed Consent: I have reviewed the patients History and Physical, chart, labs and discussed the procedure including the risks, benefits and alternatives for the proposed anesthesia with the patient or authorized representative who has indicated his/her understanding and acceptance.     Plan Discussed with: CRNA  Anesthesia Plan Comments:         Anesthesia Quick Evaluation

## 2016-04-11 NOTE — H&P (Signed)
  H and P reviewed. No changes. Uploaded at later date. 

## 2016-04-14 ENCOUNTER — Encounter: Payer: Self-pay | Admitting: Unknown Physician Specialty

## 2019-01-28 ENCOUNTER — Other Ambulatory Visit: Payer: Self-pay

## 2019-01-28 ENCOUNTER — Ambulatory Visit
Admission: EM | Admit: 2019-01-28 | Discharge: 2019-01-28 | Disposition: A | Discharge status: Home / Self Care | Payer: BLUE CROSS/BLUE SHIELD | Attending: Emergency Medicine | Admitting: Emergency Medicine

## 2019-01-28 DIAGNOSIS — F17210 Nicotine dependence, cigarettes, uncomplicated: Secondary | ICD-10-CM

## 2019-01-28 DIAGNOSIS — J111 Influenza due to unidentified influenza virus with other respiratory manifestations: Secondary | ICD-10-CM

## 2019-01-28 MED ORDER — IBUPROFEN 600 MG PO TABS: 600.0000 mg | ORAL_TABLET | Freq: Four times a day (QID) | ORAL | 0 refills | Status: DC | PRN

## 2019-01-28 MED ORDER — OSELTAMIVIR PHOSPHATE 75 MG PO CAPS: 75.0000 mg | ORAL_CAPSULE | Freq: Two times a day (BID) | ORAL | 0 refills | Status: DC

## 2019-01-28 MED ORDER — BALOXAVIR MARBOXIL(80 MG DOSE) 2 X 40 MG PO TBPK: 80.0000 mg | ORAL_TABLET | Freq: Once | ORAL | 0 refills | Status: AC

## 2019-01-28 NOTE — ED Provider Notes (Signed)
HPI  SUBJECTIVE:  Jason Vaughan is a 48 y.o. male who presents with the acute onset of body aches, feeling feverish, sore throat, cough occasionally productive of white phlegm starting 2 days ago.  No headaches, nasal congestion, rhinorrhea, sinus pain or pressure, ear pain.  He reports occasional mild wheezing.  No chest pain, shortness of breath, dyspnea on exertion.  No neck stiffness, rash, abdominal pain.  No urinary complaints.  He got a flu shot this year.  No known contacts with the flu.  No antipyretic in the past 4 to 6 hours.  No antibiotics in the past month.  He tried Tylenol Cold and flu without improvement in his symptoms.  No aggravating factors.  He has a past medical history of seasonal allergies.  No history of diabetes, hypertension, pulmonary disease, HIV or immunocompromise.  AYO:[id number], Hardin Negus, MD    Past Medical History:  Diagnosis Date  . Herniated disc, cervical    (x2) - no mvmt limitations    Past Surgical History:  Procedure Laterality Date  . KNEE ARTHROSCOPY Left 04/11/2016   Procedure: LEFT KNEE ARTHROSCOPY WITH PARTIAL MEDIAL MENISECTOMY;  Surgeon: Erin Sons, MD;  Location: [facility];  Service: Orthopedics;  Laterality: Left;  . TONSILLECTOMY  age 53  . vascetomy      Family History  Problem Relation Age of Onset  . Heart failure Mother     Social History   Tobacco Use  . Smoking status: Current Some Day Smoker  . Smokeless tobacco: Never Used  Substance Use Topics  . Alcohol use: Yes    Alcohol/week: 10.0 standard drinks    Types: 10 Cans of beer per week    Comment: occasionally  . Drug use: No    No current facility-administered medications for this encounter.   Current Outpatient Medications:  .  Baloxavir Marboxil,80 MG Dose, (XOFLUZA) 2 x 40 MG TBPK, Take 80 mg by mouth once for 1 dose., Disp: 2 each, Rfl: 0 .  ibuprofen (ADVIL,MOTRIN) 600 MG tablet, Take 1 tablet (600 mg total) by mouth every 6 (six) hours as  needed., Disp: 30 tablet, Rfl: 0 .  oseltamivir (TAMIFLU) 75 MG capsule, Take 1 capsule (75 mg total) by mouth 2 (two) times daily. X 5 days, Disp: 10 capsule, Rfl: 0  No Known Allergies   ROS  As noted in HPI.   Physical Exam  BP 128/86 (BP Location: Right Arm)   Pulse 88   Temp 98.7 F (37.1 C) (Oral)   Resp 16   Ht 5\' 11"  (1.803 m)   Wt 97.5 kg   SpO2 98%   BMI 29.99 kg/m   Constitutional: Well developed, well nourished, no acute distress Eyes:  EOMI, conjunctiva normal bilaterally HENT: Normocephalic, atraumatic,mucus membranes moist, no nasal congestion.  Erythematous mildly swollen turbinates.  No maxillary, frontal sinus tenderness.  Normal oropharynx, normal tonsils without exudates.  Uvula midline.  Positive cobblestoning.  No postnasal drip. Neck: Positive cervical lymphadenopathy, no meningismus Respiratory: Normal inspiratory effort lungs clear bilaterally, good air movement cardiovascular: Normal rate and rhythm no murmurs rubs or gallops GI: nondistended soft, nontender, active bowel sounds.  No rebound or guarding Back: No CVAT skin: No rash, skin intact Musculoskeletal: no deformities Neurologic: Alert & oriented x 3, no focal neuro deficits Psychiatric: Speech and behavior appropriate   ED Course   Medications - No data to display  No orders of the defined types were placed in this encounter.   No results found  for this or any previous visit (from the past 24 hour(s)). No results found.  ED Clinical Impression  Influenza   ED Assessment/Plan  Presentation consistent with influenza.  Will send home with Xofluza, and if that is too expensive or not available, then Tamiflu.  Advil 600 mg combined with 1 g of Tylenol 3 or 4 times a day as needed.  Push electrolyte containing fluids. Discussed MDM, treatment plan, and plan for follow-up with patient. Discussed sn/sx that should prompt return to the ED. patient agrees with plan.   Meds ordered this  encounter  Medications  . ibuprofen (ADVIL,MOTRIN) 600 MG tablet    Sig: Take 1 tablet (600 mg total) by mouth every 6 (six) hours as needed.    Dispense:  30 tablet    Refill:  0  . Baloxavir Marboxil,80 MG Dose, (XOFLUZA) 2 x 40 MG TBPK    Sig: Take 80 mg by mouth once for 1 dose.    Dispense:  2 each    Refill:  0  . oseltamivir (TAMIFLU) 75 MG capsule    Sig: Take 1 capsule (75 mg total) by mouth 2 (two) times daily. X 5 days    Dispense:  10 capsule    Refill:  0    *This clinic note was created using Scientist, clinical (histocompatibility and immunogenetics). Therefore, there may be occasional mistakes despite careful proofreading.   ?   Domenick Gong, MD 01/28/19 1133

## 2019-01-28 NOTE — Discharge Instructions (Addendum)
Xofluza, and if that is too expensive or not available, then Tamiflu.  Advil 600 mg combined with 1 g of Tylenol 3 or 4 times a day as needed.  Push electrolyte containing fluids.  Rest for the next 2 days at least.

## 2019-01-28 NOTE — ED Triage Notes (Signed)
Patient complains of cough, fever and body aches with fatigue that started 2 days ago.

## 2019-11-24 ENCOUNTER — Ambulatory Visit
Admission: RE | Admit: 2019-11-24 | Discharge: 2019-11-24 | Disposition: A | Discharge status: Home / Self Care | Payer: BC Managed Care – PPO | Source: Ambulatory Visit | Attending: Internal Medicine | Admitting: Internal Medicine

## 2019-11-24 ENCOUNTER — Other Ambulatory Visit: Payer: Self-pay | Admitting: Internal Medicine

## 2019-11-24 ENCOUNTER — Other Ambulatory Visit: Payer: Self-pay

## 2019-11-24 DIAGNOSIS — R9389 Abnormal findings on diagnostic imaging of other specified body structures: Secondary | ICD-10-CM

## 2019-11-24 DIAGNOSIS — R0789 Other chest pain: Secondary | ICD-10-CM | POA: Insufficient documentation

## 2019-11-24 MED ORDER — IOHEXOL 300 MG/ML  SOLN
75.0000 mL | Freq: Once | INTRAMUSCULAR | Status: AC | PRN
  Administered 2019-11-24: 14:00:00 75 mL via INTRAVENOUS

## 2020-06-12 ENCOUNTER — Other Ambulatory Visit: Payer: Self-pay

## 2020-06-12 ENCOUNTER — Ambulatory Visit
Admission: EM | Admit: 2020-06-12 | Discharge: 2020-06-12 | Disposition: A | Discharge status: Home / Self Care | Payer: BC Managed Care – PPO | Attending: Physician Assistant | Admitting: Physician Assistant

## 2020-06-12 ENCOUNTER — Encounter: Payer: Self-pay | Admitting: Emergency Medicine

## 2020-06-12 DIAGNOSIS — U071 COVID-19: Secondary | ICD-10-CM | POA: Diagnosis present

## 2020-06-12 DIAGNOSIS — B349 Viral infection, unspecified: Secondary | ICD-10-CM | POA: Diagnosis not present

## 2020-06-12 LAB — INFLUENZA PANEL BY PCR (TYPE A & B)
Influenza A By PCR: NEGATIVE
Influenza B By PCR: NEGATIVE

## 2020-06-12 LAB — SARS CORONAVIRUS 2 AG (30 MIN TAT): SARS Coronavirus 2 Ag: POSITIVE — AB

## 2020-06-12 MED ORDER — BENZONATATE 100 MG PO CAPS: 100.0000 mg | ORAL_CAPSULE | Freq: Three times a day (TID) | ORAL | 0 refills | Status: DC

## 2020-06-12 MED ORDER — ACETAMINOPHEN 500 MG PO TABS
1000.0000 mg | ORAL_TABLET | Freq: Once | ORAL | Status: AC
  Administered 2020-06-12: 1000 mg via ORAL

## 2020-06-12 NOTE — Discharge Instructions (Addendum)
-  Covid test positive -Isolate for 10 days.  Main treatment is symptom management.  Would contact your primary care provider should you have worsening shortness of breath -Ibuprofen and Tylenol as needed for pain and fever -Push fluids -Rest

## 2020-06-12 NOTE — ED Triage Notes (Signed)
Pt c/o body aches, headache, chills. Started last night. He says he has had a fever but has not checked it.

## 2020-06-12 NOTE — ED Provider Notes (Addendum)
[facility]    CSN: [id number] Arrival date & time: 06/12/20  1932      History   Chief Complaint Chief Complaint  Patient presents with  . Headache  . Generalized Body Aches    HPI Jason Vaughan is a 49 y.o. male.   Patient is a 49 year old male who presents with chief complaint of body aches and fever that began yesterday.  Patient states he was treated for a pressure infection with azithromycin about a month ago.  Patient denies any upper respiratory symptoms including congestion, runny nose, sore throat.  Patient denies any ear pain or fullness.  Patient denies any chest pain or shortness of breath.  Patient denies any sick contacts including flu or Covid.  Patient states initially he thought his symptoms were related to heat exhaustion from working out side but had no relief after coming inside and laying down.  Patient is taken some Mucinex without any help.  Patient states he did not get the flu shot this past fall and has not had a Covid vaccine.     Past Medical History:  Diagnosis Date  . Herniated disc, cervical    (x2) - no mvmt limitations    There are no problems to display for this patient.   Past Surgical History:  Procedure Laterality Date  . KNEE ARTHROSCOPY Left 04/11/2016   Procedure: LEFT KNEE ARTHROSCOPY WITH PARTIAL MEDIAL MENISECTOMY;  Surgeon: Erin Sons, MD;  Location: [facility];  Service: Orthopedics;  Laterality: Left;  . TONSILLECTOMY  age 25  . vascetomy         Home Medications    Prior to Admission medications   Medication Sig Start Date End Date Taking? Authorizing Provider  meloxicam (MOBIC) 15 MG tablet meloxicam 15 mg tablet   Yes [provider]  benzonatate (TESSALON) 100 MG capsule Take 1 capsule (100 mg total) by mouth every 8 (eight) hours. 06/12/20   Candis Schatz, PA-C    Family History Family History  Problem Relation Age of Onset  . Heart failure Mother     Social  History Social History   Tobacco Use  . Smoking status: Current Some Day Smoker  . Smokeless tobacco: Never Used  Vaping Use  . Vaping Use: Never used  Substance Use Topics  . Alcohol use: Yes    Alcohol/week: 10.0 standard drinks    Types: 10 Cans of beer per week    Comment: occasionally  . Drug use: No     Allergies   Patient has no known allergies.   Review of Systems Review of Systems as noted above in HPI.  Other systems reviewed and found to be negative.   Physical Exam Triage Vital Signs ED Triage Vitals  Enc Vitals Group     BP 06/12/20 1949 126/89     Pulse Rate 06/12/20 1949 89     Resp 06/12/20 1949 18     Temp 06/12/20 1949 (!) 102.4 F (39.1 C)     Temp Source 06/12/20 1949 Oral     SpO2 06/12/20 1949 98 %     Weight 06/12/20 1946 214 lb 15.2 oz (97.5 kg)     Height 06/12/20 1946 5\' 11"  (1.803 m)     Head Circumference --      Peak Flow --      Pain Score 06/12/20 1946 8     Pain Loc --      Pain Edu? --  Excl. in GC? --    No data found.  Updated Vital Signs BP 126/89 (BP Location: Left Arm)   Pulse 89   Temp (!) 102.4 F (39.1 C) (Oral)   Resp 18   Ht 5\' 11"  (1.803 m)   Wt 214 lb 15.2 oz (97.5 kg)   SpO2 98%   BMI 29.98 kg/m    Physical Exam Constitutional:      Appearance: He is well-developed. He is not ill-appearing.  HENT:     Right Ear: Ear canal normal.     Left Ear: Ear canal normal.  Cardiovascular:     Rate and Rhythm: Normal rate and regular rhythm.     Pulses: Normal pulses.     Heart sounds: No murmur heard.   Pulmonary:     Effort: Pulmonary effort is normal.     Breath sounds: Normal breath sounds.  Skin:    General: Skin is dry.     Capillary Refill: Capillary refill takes less than 2 seconds.  Neurological:     General: No focal deficit present.     Mental Status: He is alert and oriented to person, place, and time.       UC Treatments / Results  Labs (all labs ordered are listed, but only  abnormal results are displayed) Labs Reviewed  SARS CORONAVIRUS 2 AG (30 MIN TAT) - Abnormal; Notable for the following components:      Result Value   SARS Coronavirus 2 Ag POSITIVE (*)    All other components within normal limits  SARS CORONAVIRUS 2 (TAT 6-24 HRS)  INFLUENZA PANEL BY PCR (TYPE A & B)    EKG   Radiology No results found.  Procedures Procedures (including critical care time)  Medications Ordered in UC Medications  acetaminophen (TYLENOL) tablet 1,000 mg (1,000 mg Oral Given 06/12/20 1958)    Initial Impression / Assessment and Plan / UC Course  I have reviewed the triage vital signs and the nursing notes.  Pertinent labs & imaging results that were available during my care of the patient were reviewed by me and considered in my medical decision making (see chart for details).     Patient with fever and body aches that started yesterday.  Patient is taken some Mucinex without any relief.  Temperature 102.4 here in the clinic.  No reported flu or Covid exposure.  Patient states he did not get the flu shot this past fall and has not had a Covid vaccine.  Patient rapid antibody test positive for Covid.  PCR test sent as well.  Give him instructions for self quarantine for 10 days.  Tessalon for cough.  Ibuprofen Tylenol for pain and fever push fluids.  Instruction sheets added for self-care at home.  Recommend contacting PCP, especially if develops a increasing shortness of breath. Final Clinical Impressions(s) / UC Diagnoses   Final diagnoses:  Viral syndrome  COVID-19 virus infection     Discharge Instructions     -Covid test positive -Isolate for 10 days.  Main treatment is symptom management.  Would contact your primary care provider should you have worsening shortness of breath -Ibuprofen and Tylenol as needed for pain and fever -Push fluids -Rest    ED Prescriptions    Medication Sig Dispense Auth. Provider   benzonatate (TESSALON) 100 MG capsule  Take 1 capsule (100 mg total) by mouth every 8 (eight) hours. 21 capsule 06/14/20, PA-C     PDMP not reviewed this encounter.  Candis Schatz, PA-C 06/12/20 2027    Candis Schatz, PA-C 06/12/20 2032

## 2020-06-13 ENCOUNTER — Telehealth: Payer: Self-pay | Admitting: Infectious Diseases

## 2020-06-13 LAB — SARS CORONAVIRUS 2 (TAT 6-24 HRS): SARS Coronavirus 2: POSITIVE — AB

## 2020-06-13 NOTE — Telephone Encounter (Signed)
Called to discuss with patient about Covid symptoms and the use of Regeneron, a monoclonal antibody infusion for those with mild to moderate Covid symptoms and at a high risk of hospitalization.  Pt is qualified for this infusion at the [facility] infusion center due to [employer].    He is not interested at this time. Information for medication and clinic call back sent via email per his request.

## 2021-05-28 ENCOUNTER — Other Ambulatory Visit: Payer: Self-pay

## 2021-05-28 ENCOUNTER — Ambulatory Visit
Admission: EM | Admit: 2021-05-28 | Discharge: 2021-05-28 | Disposition: A | Discharge status: Home / Self Care | Payer: BC Managed Care – PPO

## 2021-05-28 DIAGNOSIS — S0501XA Injury of conjunctiva and corneal abrasion without foreign body, right eye, initial encounter: Secondary | ICD-10-CM

## 2021-05-28 DIAGNOSIS — T1581XA Foreign body in other and multiple parts of external eye, right eye, initial encounter: Secondary | ICD-10-CM | POA: Diagnosis not present

## 2021-05-28 NOTE — ED Triage Notes (Signed)
Pt c/o right eye irritation since last night. Pt states he woke up and felt like something was in his eye. He did try to flush they eye. Pt states they eye is burning and uncomfortable. Pt also has some blurry vision.

## 2021-05-28 NOTE — Discharge Instructions (Addendum)
Please go to [facility] up to help from the urgent care for evaluation of that possible foreign body in your right eye.

## 2021-05-28 NOTE — ED Provider Notes (Signed)
[facility]    CSN: [id number] Arrival date & time: 05/28/21  1937      History   Chief Complaint Chief Complaint  Patient presents with   Eye Problem    right    HPI DYLEN MCELHANNON is a 50 y.o. male.   HPI  50 year old male here for evaluation of right eye irritation.  Patient reports that he developed right eye irritation this morning at 3 AM.  He states he felt like something got in his eye and he has had irritation ever since.  He reports that the eye has a burning sensation, hurts occasionally when he moves his eye and his vision is blurry.  Visual acuity from triage show bilateral distance vision of 20/13 with left eye visual acuity being 20/15 and right eye visual acuity being 20/40.  Patient has flushed his eye twice this morning without relief of symptoms.  Past Medical History:  Diagnosis Date   Herniated disc, cervical    (x2) - no mvmt limitations    There are no problems to display for this patient.   Past Surgical History:  Procedure Laterality Date   KNEE ARTHROSCOPY Left 04/11/2016   Procedure: LEFT KNEE ARTHROSCOPY WITH PARTIAL MEDIAL MENISECTOMY;  Surgeon: Erin Sons, MD;  Location: [facility];  Service: Orthopedics;  Laterality: Left;   TONSILLECTOMY  age 60   vascetomy         Home Medications    Prior to Admission medications   Medication Sig Start Date End Date Taking? Authorizing Provider  meloxicam (MOBIC) 15 MG tablet meloxicam 15 mg tablet   Yes [provider]    Family History Family History  Problem Relation Age of Onset   Heart failure Mother     Social History Social History   Tobacco Use   Smoking status: Some Days    Pack years: 0.00   Smokeless tobacco: Never  Vaping Use   Vaping Use: Never used  Substance Use Topics   Alcohol use: Yes    Alcohol/week: 10.0 standard drinks    Types: 10 Cans of beer per week    Comment: occasionally   Drug use: No     Allergies   Patient  has no known allergies.   Review of Systems Review of Systems  Eyes:  Positive for photophobia, pain, redness and visual disturbance.    Physical Exam Triage Vital Signs ED Triage Vitals  Enc Vitals Group     BP 05/28/21 0858 121/78     Pulse Rate 05/28/21 0858 67     Resp 05/28/21 0858 18     Temp 05/28/21 0858 97.9 F (36.6 C)     Temp Source 05/28/21 0858 Oral     SpO2 05/28/21 0858 98 %     Weight 05/28/21 0856 220 lb (99.8 kg)     Height 05/28/21 0856 5\' 11"  (1.803 m)     Head Circumference --      Peak Flow --      Pain Score 05/28/21 0855 8     Pain Loc --      Pain Edu? --      Excl. in GC? --    No data found.  Updated Vital Signs BP 121/78 (BP Location: Left Arm)   Pulse 67   Temp 97.9 F (36.6 C) (Oral)   Resp 18   Ht 5\' 11"  (1.803 m)   Wt 220 lb (99.8 kg)   SpO2 98%   BMI 30.68  kg/m   Visual Acuity Right Eye Distance: 20/40 Left Eye Distance: 20/15 Bilateral Distance: 20/13  Right Eye Near:   Left Eye Near:    Bilateral Near:     Physical Exam Vitals and nursing note reviewed.  Constitutional:      General: He is not in acute distress.    Appearance: Normal appearance. He is normal weight. He is not ill-appearing.  HENT:     Head: Normocephalic and atraumatic.  Eyes:     General: No scleral icterus.       Right eye: No discharge.        Left eye: No discharge.     Extraocular Movements: Extraocular movements intact.     Pupils: Pupils are equal, round, and reactive to light.     Comments: Conjunctiva of right eye is injected.  There is no discharge present.  Skin:    General: Skin is warm and dry.     Capillary Refill: Capillary refill takes less than 2 seconds.     Findings: No erythema or rash.  Neurological:     General: No focal deficit present.     Mental Status: He is alert and oriented to person, place, and time.  Psychiatric:        Mood and Affect: Mood normal.        Behavior: Behavior normal.        Thought Content:  Thought content normal.        Judgment: Judgment normal.     UC Treatments / Results  Labs (all labs ordered are listed, but only abnormal results are displayed) Labs Reviewed - No data to display  EKG   Radiology No results found.  Procedures Procedures (including critical care time)  Medications Ordered in UC Medications - No data to display  Initial Impression / Assessment and Plan / UC Course  I have reviewed the triage vital signs and the nursing notes.  Pertinent labs & imaging results that were available during my care of the patient were reviewed by me and considered in my medical decision making (see chart for details).  Patient is a very pleasant 50 year old male who appears to be in a moderate degree of discomfort in his right eye here for evaluation of irritation, burning sensation, and blurry vision that started abruptly this morning as outlined in the HPI above.  Patient's physical exam does reveal on injected right eye with normal labral conjunctiva.  Patient's pupils are equal round and reactive and his EOM is intact.  Wood lamp exam with fluorescein dye performed which shows a linear corneal abrasion extending all the way across midline of his vision in his iris.  In the superior aspect of the globe above the iris there is a black dot that does not pick up fluorescein dye but the surrounding tissue does and I am concerned it might be a lodged foreign body.  I placed a call to [facility] to try and secure an appointment.  I spoke with [facility] and they will see him and their [place] office to evaluate for the possible foreign body. They have asked that I send him right over.  Will discharge patient with diagnosis of corneal abrasion of right eye and possible foreign body of right eye.   Final Clinical Impressions(s) / UC Diagnoses   Final diagnoses:  Abrasion of right cornea, initial encounter  Foreign body of sclera of right eye, initial  encounter     Discharge Instructions  Please go to [facility] up to help from the urgent care for evaluation of that possible foreign body in your right eye.     ED Prescriptions   None    PDMP not reviewed this encounter.   Becky Augusta, NP 05/28/21 [phone]

## 2023-12-15 ENCOUNTER — Encounter: Payer: Self-pay | Admitting: *Deleted

## 2023-12-24 ENCOUNTER — Encounter: Payer: Self-pay | Admitting: *Deleted

## 2024-01-01 ENCOUNTER — Ambulatory Visit: Payer: BC Managed Care – PPO | Admitting: Anesthesiology

## 2024-01-01 ENCOUNTER — Ambulatory Visit
Admission: RE | Admit: 2024-01-01 | Discharge: 2024-01-01 | Disposition: A | Discharge status: Home / Self Care | Payer: BC Managed Care – PPO | Attending: Gastroenterology | Admitting: Gastroenterology

## 2024-01-01 ENCOUNTER — Encounter: Payer: Self-pay | Admitting: *Deleted

## 2024-01-01 ENCOUNTER — Encounter
Admission: RE | Disposition: A | Discharge status: Home / Self Care | Payer: Self-pay | Source: Home / Self Care | Attending: Gastroenterology

## 2024-01-01 DIAGNOSIS — Z87891 Personal history of nicotine dependence: Secondary | ICD-10-CM | POA: Diagnosis not present

## 2024-01-01 DIAGNOSIS — Z1211 Encounter for screening for malignant neoplasm of colon: Secondary | ICD-10-CM | POA: Diagnosis present

## 2024-01-01 DIAGNOSIS — M1990 Unspecified osteoarthritis, unspecified site: Secondary | ICD-10-CM | POA: Insufficient documentation

## 2024-01-01 DIAGNOSIS — K640 First degree hemorrhoids: Secondary | ICD-10-CM | POA: Insufficient documentation

## 2024-01-01 DIAGNOSIS — K635 Polyp of colon: Secondary | ICD-10-CM | POA: Insufficient documentation

## 2024-01-01 HISTORY — DX: Other cervical disc degeneration, unspecified cervical region: M50.30

## 2024-01-01 HISTORY — PX: COLONOSCOPY WITH PROPOFOL: SHX5780

## 2024-01-01 HISTORY — PX: POLYPECTOMY: SHX5525

## 2024-01-01 SURGERY — COLONOSCOPY WITH PROPOFOL
Anesthesia: General

## 2024-01-01 MED ORDER — SODIUM CHLORIDE 0.9 % IV SOLN: INTRAVENOUS | Status: DC

## 2024-01-01 MED ORDER — PROPOFOL 10 MG/ML IV BOLUS
INTRAVENOUS | Status: AC
  Filled 2024-01-01: qty 20

## 2024-01-01 MED ORDER — PROPOFOL 10 MG/ML IV BOLUS
INTRAVENOUS | Status: DC | PRN
  Administered 2024-01-01 (×2): 50 mg via INTRAVENOUS

## 2024-01-01 MED ORDER — GLYCOPYRROLATE 0.2 MG/ML IJ SOLN
INTRAMUSCULAR | Status: AC
  Filled 2024-01-01: qty 2

## 2024-01-01 MED ORDER — LIDOCAINE HCL (CARDIAC) PF 100 MG/5ML IV SOSY
PREFILLED_SYRINGE | INTRAVENOUS | Status: DC | PRN
  Administered 2024-01-01: 80 mg via INTRAVENOUS

## 2024-01-01 MED ORDER — EPHEDRINE 5 MG/ML INJ
INTRAVENOUS | Status: AC
  Filled 2024-01-01: qty 5

## 2024-01-01 MED ORDER — DEXMEDETOMIDINE HCL IN NACL 80 MCG/20ML IV SOLN
INTRAVENOUS | Status: DC | PRN
  Administered 2024-01-01: 20 ug via INTRAVENOUS

## 2024-01-01 MED ORDER — LIDOCAINE HCL (PF) 2 % IJ SOLN
INTRAMUSCULAR | Status: AC
  Filled 2024-01-01: qty 25

## 2024-01-01 MED ORDER — PROPOFOL 500 MG/50ML IV EMUL
INTRAVENOUS | Status: DC | PRN
  Administered 2024-01-01: 75 ug/kg/min via INTRAVENOUS

## 2024-01-01 NOTE — Transfer of Care (Signed)
Immediate Anesthesia Transfer of Care Note  Patient: Jason Vaughan  Procedure(s) Performed: COLONOSCOPY WITH PROPOFOL POLYPECTOMY  Patient Location: PACU  Anesthesia Type:General  Level of Consciousness: sedated  Airway & Oxygen Therapy: Patient Spontanous Breathing  Post-op Assessment: Report given to RN and Post -op Vital signs reviewed and stable  Post vital signs: Reviewed and stable  Last Vitals:  Vitals Value Taken Time  BP    Temp    Pulse    Resp    SpO2      Last Pain:  Vitals:   01/01/24 1215  TempSrc: Temporal  PainSc: 0-No pain         Complications: No notable events documented.

## 2024-01-01 NOTE — Anesthesia Postprocedure Evaluation (Signed)
Anesthesia Post Note  Patient: Jason Vaughan  Procedure(s) Performed: COLONOSCOPY WITH PROPOFOL POLYPECTOMY  Patient location during evaluation: PACU Anesthesia Type: General Level of consciousness: awake and awake and alert Pain management: satisfactory to patient Vital Signs Assessment: post-procedure vital signs reviewed and stable Respiratory status: spontaneous breathing Cardiovascular status: stable Anesthetic complications: no   No notable events documented.   Last Vitals:  Vitals:   01/01/24 1340 01/01/24 1350  BP: 91/68 102/73  Pulse: (!) 57 (!) 51  Resp: 18 20  Temp:    SpO2: 98% 100%    Last Pain:  Vitals:   01/01/24 1350  TempSrc:   PainSc: 0-No pain                 VAN STAVEREN,Dmarius Reeder

## 2024-01-01 NOTE — H&P (Signed)
Outpatient short stay form Pre-procedure 01/01/2024  Regis Bill, MD  Primary Physician: Danella Penton, MD  Reason for visit:  Screening  History of present illness:    53 y/o gentleman with history of arthritis here for index screening colonoscopy. No blood thinners. No family history of GI malignancies. No significant abdominal surgeries.    Current Facility-Administered Medications:    0.9 %  sodium chloride infusion, , Intravenous, Continuous, Moataz Tavis, Rossie Muskrat, MD  Medications Prior to Admission  Medication Sig Dispense Refill Last Dose/Taking   meloxicam (MOBIC) 15 MG tablet meloxicam 15 mg tablet   Past Week   sertraline (ZOLOFT) 50 MG tablet Take 50 mg by mouth daily.   Past Week     No Known Allergies   Past Medical History:  Diagnosis Date   DDD (degenerative disc disease), cervical    Herniated disc, cervical    (x2) - no mvmt limitations    Review of systems:  Otherwise negative.    Physical Exam  Gen: Alert, oriented. Appears stated age.  HEENT: PERRLA. Lungs: No respiratory distress CV: RRR Abd: soft, benign, no masses Ext: No edema    Planned procedures: Proceed with colonoscopy. The patient understands the nature of the planned procedure, indications, risks, alternatives and potential complications including but not limited to bleeding, infection, perforation, damage to internal organs and possible oversedation/side effects from anesthesia. The patient agrees and gives consent to proceed.  Please refer to procedure notes for findings, recommendations and patient disposition/instructions.     Regis Bill, MD [facility]

## 2024-01-01 NOTE — Anesthesia Preprocedure Evaluation (Signed)
Anesthesia Evaluation  Patient identified by MRN, date of birth, ID band Patient awake    Reviewed: Allergy & Precautions, NPO status , Patient's Chart, lab work & pertinent test results  Airway Mallampati: II  TM Distance: >3 FB Neck ROM: full    Dental  (+) Teeth Intact   Pulmonary neg pulmonary ROS, Patient abstained from smoking., former smoker   Pulmonary exam normal        Cardiovascular Exercise Tolerance: Good negative cardio ROS Normal cardiovascular exam Rhythm:Regular Rate:Normal     Neuro/Psych negative neurological ROS  negative psych ROS   GI/Hepatic negative GI ROS, Neg liver ROS,,,  Endo/Other  negative endocrine ROS    Renal/GU negative Renal ROS  negative genitourinary   Musculoskeletal  (+) Arthritis ,    Abdominal Normal abdominal exam  (+)   Peds negative pediatric ROS (+)  Hematology negative hematology ROS (+)   Anesthesia Other Findings Past Medical History: No date: DDD (degenerative disc disease), cervical No date: Herniated disc, cervical     Comment:  (x2) - no mvmt limitations  Past Surgical History: 04/11/2016: KNEE ARTHROSCOPY; Left     Comment:  Procedure: LEFT KNEE ARTHROSCOPY WITH PARTIAL MEDIAL               MENISECTOMY;  Surgeon: Erin Sons, MD;  Location:               [facility];  Service: Orthopedics;  Laterality:               Left; age 53: TONSILLECTOMY No date: VASECTOMY  BMI    Body Mass Index: 30.85 kg/m      Reproductive/Obstetrics negative OB ROS                             Anesthesia Physical Anesthesia Plan  ASA: 2  Anesthesia Plan: General   Post-op Pain Management:    Induction: Intravenous  PONV Risk Score and Plan: Propofol infusion and TIVA  Airway Management Planned: Natural Airway and Nasal Cannula  Additional Equipment:   Intra-op Plan:   Post-operative Plan:   Informed Consent: I have  reviewed the patients History and Physical, chart, labs and discussed the procedure including the risks, benefits and alternatives for the proposed anesthesia with the patient or authorized representative who has indicated his/her understanding and acceptance.     Dental Advisory Given  Plan Discussed with: CRNA and Surgeon  Anesthesia Plan Comments:        Anesthesia Quick Evaluation

## 2024-01-01 NOTE — Op Note (Signed)
[facility] Patient Name: Jason Vaughan Procedure Date: 01/01/2024 12:54 PM MRN: [id number] Account #: [id number] Date of Birth: Aug 27, 1971 Admit Type: Outpatient Age: 53 Room: [facility] ENDO ROOM 3 Gender: Male Note Status: Finalized Instrument Name: Prentice Docker [id number] Procedure:             Colonoscopy Indications:           Screening for colorectal malignant neoplasm Providers:             Eather Colas MD, MD Referring MD:          Danella Penton, MD (Referring MD) Medicines:             Monitored Anesthesia Care Complications:         No immediate complications. Estimated blood loss:                         Minimal. Procedure:             Pre-Anesthesia Assessment:                        - Prior to the procedure, a History and Physical was                         performed, and patient medications and allergies were                         reviewed. The patient is competent. The risks and                         benefits of the procedure and the sedation options and                         risks were discussed with the patient. All questions                         were answered and informed consent was obtained.                         Patient identification and proposed procedure were                         verified by the physician, the nurse, the                         anesthesiologist, the anesthetist and the technician                         in the endoscopy suite. Mental Status Examination:                         alert and oriented. Airway Examination: normal                         oropharyngeal airway and neck mobility. Respiratory                         Examination: clear to auscultation. CV Examination:  normal. Prophylactic Antibiotics: The patient does not                         require prophylactic antibiotics. Prior                         Anticoagulants: The patient has taken no anticoagulant                          or antiplatelet agents. ASA Grade Assessment: II - A                         patient with mild systemic disease. After reviewing                         the risks and benefits, the patient was deemed in                         satisfactory condition to undergo the procedure. The                         anesthesia plan was to use monitored anesthesia care                         (MAC). Immediately prior to administration of                         medications, the patient was re-assessed for adequacy                         to receive sedatives. The heart rate, respiratory                         rate, oxygen saturations, blood pressure, adequacy of                         pulmonary ventilation, and response to care were                         monitored throughout the procedure. The physical                         status of the patient was re-assessed after the                         procedure.                        After obtaining informed consent, the colonoscope was                         passed under direct vision. Throughout the procedure,                         the patient's blood pressure, pulse, and oxygen                         saturations were monitored continuously. The  Colonoscope was introduced through the anus and                         advanced to the the terminal ileum, with                         identification of the appendiceal orifice and IC                         valve. The colonoscopy was performed without                         difficulty. The patient tolerated the procedure well.                         The quality of the bowel preparation was good. The                         terminal ileum, ileocecal valve, appendiceal orifice,                         and rectum were photographed. Findings:      The perianal and digital rectal examinations were normal.      The terminal ileum appeared normal.      A 3 mm polyp  was found in the recto-sigmoid colon. The polyp was       hyperplastic. The polyp was removed with a cold snare. Resection and       retrieval were complete. Estimated blood loss was minimal.      Internal hemorrhoids were found during retroflexion. The hemorrhoids       were Grade I (internal hemorrhoids that do not prolapse).      The exam was otherwise without abnormality on direct and retroflexion       views. Impression:            - The examined portion of the ileum was normal.                        - One 3 mm polyp at the recto-sigmoid colon, removed                         with a cold snare. Resected and retrieved.                        - Internal hemorrhoids.                        - The examination was otherwise normal on direct and                         retroflexion views. Recommendation:        - Discharge patient to home.                        - Resume previous diet.                        - Continue present medications.                        -  Await pathology results.                        - Repeat colonoscopy in 10 years for screening                         purposes.                        - Return to referring physician as previously                         scheduled. Procedure Code(s):     --- Professional ---                        [id number], Colonoscopy, flexible; with removal of                         tumor(s), polyp(s), or other lesion(s) by snare                         technique Diagnosis Code(s):     --- Professional ---                        Z12.11, Encounter for screening for malignant neoplasm                         of colon                        K64.0, First degree hemorrhoids                        D12.7, Benign neoplasm of rectosigmoid junction CPT copyright 2022 [employer]. All rights reserved. The codes documented in this report are preliminary and upon coder review may  be revised to meet current compliance  requirements. Eather Colas MD, MD 01/01/2024 1:30:58 PM Number of Addenda: 0 Note Initiated On: 01/01/2024 12:54 PM Scope Withdrawal Time: 0 hours 9 minutes 52 seconds  Total Procedure Duration: 0 hours 12 minutes 3 seconds  Estimated Blood Loss:  Estimated blood loss was minimal.      [facility]

## 2024-01-01 NOTE — Interval H&P Note (Signed)
History and Physical Interval Note:  01/01/2024 12:41 PM  Jason Vaughan  has presented today for surgery, with the diagnosis of Z12.11 (ICD-10-CM) - Colon cancer screening.  The various methods of treatment have been discussed with the patient and family. After consideration of risks, benefits and other options for treatment, the patient has consented to  Procedure(s): COLONOSCOPY WITH PROPOFOL (N/A) as a surgical intervention.  The patient's history has been reviewed, patient examined, no change in status, stable for surgery.  I have reviewed the patient's chart and labs.  Questions were answered to the patient's satisfaction.     Regis Bill  Ok to proceed with colonoscopy

## 2024-01-04 ENCOUNTER — Encounter: Payer: Self-pay | Admitting: Gastroenterology

## 2024-01-04 LAB — SURGICAL PATHOLOGY

## 2024-06-09 ENCOUNTER — Other Ambulatory Visit: Payer: Self-pay | Admitting: Internal Medicine

## 2024-06-09 DIAGNOSIS — Z0000 Encounter for general adult medical examination without abnormal findings: Secondary | ICD-10-CM

## 2024-06-09 DIAGNOSIS — E782 Mixed hyperlipidemia: Secondary | ICD-10-CM

## 2024-06-20 ENCOUNTER — Ambulatory Visit
Admission: RE | Admit: 2024-06-20 | Discharge: 2024-06-20 | Disposition: A | Discharge status: Home / Self Care | Payer: Self-pay | Source: Ambulatory Visit | Attending: Internal Medicine | Admitting: Internal Medicine

## 2024-06-20 DIAGNOSIS — E782 Mixed hyperlipidemia: Secondary | ICD-10-CM | POA: Insufficient documentation

## 2024-06-20 DIAGNOSIS — Z0000 Encounter for general adult medical examination without abnormal findings: Secondary | ICD-10-CM | POA: Insufficient documentation
